# Patient Record
Sex: Female | Born: 1950 | Race: White | Hispanic: No | Marital: Married | Smoking: Current some day smoker
Health system: Southern US, Community
[De-identification: ages and names within clinical notes are randomized; demographics above are authoritative.]

## PROBLEM LIST (undated history)

## (undated) DIAGNOSIS — K219 Gastro-esophageal reflux disease without esophagitis: Secondary | ICD-10-CM

## (undated) DIAGNOSIS — M1990 Unspecified osteoarthritis, unspecified site: Secondary | ICD-10-CM

## (undated) DIAGNOSIS — H269 Unspecified cataract: Secondary | ICD-10-CM

## (undated) DIAGNOSIS — E785 Hyperlipidemia, unspecified: Secondary | ICD-10-CM

## (undated) DIAGNOSIS — I10 Essential (primary) hypertension: Secondary | ICD-10-CM

## (undated) HISTORY — PX: WISDOM TOOTH EXTRACTION: SHX21

## (undated) HISTORY — DX: Gastro-esophageal reflux disease without esophagitis: K21.9

## (undated) HISTORY — DX: Hyperlipidemia, unspecified: E78.5

## (undated) HISTORY — DX: Unspecified cataract: H26.9

## (undated) HISTORY — DX: Unspecified osteoarthritis, unspecified site: M19.90

## (undated) HISTORY — PX: HERNIA REPAIR: SHX51

## (undated) HISTORY — PX: PARTIAL HYSTERECTOMY: SHX80

---

## 2015-09-29 DIAGNOSIS — M1712 Unilateral primary osteoarthritis, left knee: Secondary | ICD-10-CM | POA: Diagnosis not present

## 2015-09-29 DIAGNOSIS — M1612 Unilateral primary osteoarthritis, left hip: Secondary | ICD-10-CM | POA: Diagnosis not present

## 2015-09-29 DIAGNOSIS — Z23 Encounter for immunization: Secondary | ICD-10-CM | POA: Diagnosis not present

## 2015-11-08 DIAGNOSIS — E7800 Pure hypercholesterolemia, unspecified: Secondary | ICD-10-CM | POA: Diagnosis not present

## 2015-11-08 DIAGNOSIS — R739 Hyperglycemia, unspecified: Secondary | ICD-10-CM | POA: Diagnosis not present

## 2015-11-18 DIAGNOSIS — Z13220 Encounter for screening for lipoid disorders: Secondary | ICD-10-CM | POA: Diagnosis not present

## 2015-11-18 DIAGNOSIS — Z720 Tobacco use: Secondary | ICD-10-CM | POA: Diagnosis not present

## 2015-11-18 DIAGNOSIS — Z23 Encounter for immunization: Secondary | ICD-10-CM | POA: Diagnosis not present

## 2016-01-10 DIAGNOSIS — R300 Dysuria: Secondary | ICD-10-CM | POA: Diagnosis not present

## 2016-01-10 DIAGNOSIS — N390 Urinary tract infection, site not specified: Secondary | ICD-10-CM | POA: Diagnosis not present

## 2016-01-10 DIAGNOSIS — J00 Acute nasopharyngitis [common cold]: Secondary | ICD-10-CM | POA: Diagnosis not present

## 2016-01-10 DIAGNOSIS — R350 Frequency of micturition: Secondary | ICD-10-CM | POA: Diagnosis not present

## 2016-01-14 DIAGNOSIS — R509 Fever, unspecified: Secondary | ICD-10-CM | POA: Diagnosis not present

## 2016-01-14 DIAGNOSIS — R05 Cough: Secondary | ICD-10-CM | POA: Diagnosis not present

## 2016-03-11 DIAGNOSIS — Z4682 Encounter for fitting and adjustment of non-vascular catheter: Secondary | ICD-10-CM | POA: Diagnosis not present

## 2016-03-11 DIAGNOSIS — F17200 Nicotine dependence, unspecified, uncomplicated: Secondary | ICD-10-CM | POA: Diagnosis not present

## 2016-03-11 DIAGNOSIS — S270XXA Traumatic pneumothorax, initial encounter: Secondary | ICD-10-CM | POA: Diagnosis not present

## 2016-03-11 DIAGNOSIS — W19XXXA Unspecified fall, initial encounter: Secondary | ICD-10-CM | POA: Diagnosis not present

## 2016-03-11 DIAGNOSIS — Z885 Allergy status to narcotic agent status: Secondary | ICD-10-CM | POA: Diagnosis not present

## 2016-03-11 DIAGNOSIS — S2242XA Multiple fractures of ribs, left side, initial encounter for closed fracture: Secondary | ICD-10-CM | POA: Diagnosis not present

## 2016-03-11 DIAGNOSIS — J449 Chronic obstructive pulmonary disease, unspecified: Secondary | ICD-10-CM | POA: Diagnosis not present

## 2016-03-11 DIAGNOSIS — E785 Hyperlipidemia, unspecified: Secondary | ICD-10-CM | POA: Diagnosis not present

## 2016-03-11 DIAGNOSIS — J939 Pneumothorax, unspecified: Secondary | ICD-10-CM | POA: Diagnosis not present

## 2016-03-11 DIAGNOSIS — S2232XA Fracture of one rib, left side, initial encounter for closed fracture: Secondary | ICD-10-CM | POA: Diagnosis not present

## 2016-03-12 DIAGNOSIS — S2243XA Multiple fractures of ribs, bilateral, initial encounter for closed fracture: Secondary | ICD-10-CM | POA: Diagnosis not present

## 2016-03-12 DIAGNOSIS — S270XXA Traumatic pneumothorax, initial encounter: Secondary | ICD-10-CM | POA: Diagnosis present

## 2016-03-12 DIAGNOSIS — J9811 Atelectasis: Secondary | ICD-10-CM | POA: Diagnosis not present

## 2016-03-12 DIAGNOSIS — J939 Pneumothorax, unspecified: Secondary | ICD-10-CM | POA: Diagnosis not present

## 2016-03-12 DIAGNOSIS — F17200 Nicotine dependence, unspecified, uncomplicated: Secondary | ICD-10-CM | POA: Diagnosis present

## 2016-03-12 DIAGNOSIS — E785 Hyperlipidemia, unspecified: Secondary | ICD-10-CM | POA: Diagnosis present

## 2016-03-12 DIAGNOSIS — S2242XA Multiple fractures of ribs, left side, initial encounter for closed fracture: Secondary | ICD-10-CM | POA: Diagnosis present

## 2016-03-12 DIAGNOSIS — Z885 Allergy status to narcotic agent status: Secondary | ICD-10-CM | POA: Diagnosis not present

## 2016-03-12 DIAGNOSIS — J449 Chronic obstructive pulmonary disease, unspecified: Secondary | ICD-10-CM | POA: Diagnosis present

## 2016-04-12 DIAGNOSIS — R05 Cough: Secondary | ICD-10-CM | POA: Diagnosis not present

## 2016-04-12 DIAGNOSIS — S2242XA Multiple fractures of ribs, left side, initial encounter for closed fracture: Secondary | ICD-10-CM | POA: Diagnosis not present

## 2016-04-12 DIAGNOSIS — S272XXA Traumatic hemopneumothorax, initial encounter: Secondary | ICD-10-CM | POA: Diagnosis not present

## 2016-04-12 DIAGNOSIS — J438 Other emphysema: Secondary | ICD-10-CM | POA: Diagnosis not present

## 2016-04-19 DIAGNOSIS — Z1389 Encounter for screening for other disorder: Secondary | ICD-10-CM | POA: Diagnosis not present

## 2016-04-19 DIAGNOSIS — S2242XA Multiple fractures of ribs, left side, initial encounter for closed fracture: Secondary | ICD-10-CM | POA: Diagnosis not present

## 2016-04-19 DIAGNOSIS — S270XXA Traumatic pneumothorax, initial encounter: Secondary | ICD-10-CM | POA: Diagnosis not present

## 2016-05-15 DIAGNOSIS — R739 Hyperglycemia, unspecified: Secondary | ICD-10-CM | POA: Diagnosis not present

## 2016-05-15 DIAGNOSIS — E6609 Other obesity due to excess calories: Secondary | ICD-10-CM | POA: Diagnosis not present

## 2016-05-15 DIAGNOSIS — Z13220 Encounter for screening for lipoid disorders: Secondary | ICD-10-CM | POA: Diagnosis not present

## 2016-05-15 DIAGNOSIS — E7800 Pure hypercholesterolemia, unspecified: Secondary | ICD-10-CM | POA: Diagnosis not present

## 2016-05-15 DIAGNOSIS — Z720 Tobacco use: Secondary | ICD-10-CM | POA: Diagnosis not present

## 2016-05-22 DIAGNOSIS — E7800 Pure hypercholesterolemia, unspecified: Secondary | ICD-10-CM | POA: Diagnosis not present

## 2016-05-22 DIAGNOSIS — S2242XD Multiple fractures of ribs, left side, subsequent encounter for fracture with routine healing: Secondary | ICD-10-CM | POA: Diagnosis not present

## 2016-06-28 DIAGNOSIS — Z6825 Body mass index (BMI) 25.0-25.9, adult: Secondary | ICD-10-CM | POA: Diagnosis not present

## 2016-06-28 DIAGNOSIS — R102 Pelvic and perineal pain: Secondary | ICD-10-CM | POA: Diagnosis not present

## 2016-07-12 DIAGNOSIS — R1032 Left lower quadrant pain: Secondary | ICD-10-CM | POA: Diagnosis not present

## 2016-07-12 DIAGNOSIS — R87610 Atypical squamous cells of undetermined significance on cytologic smear of cervix (ASC-US): Secondary | ICD-10-CM | POA: Diagnosis not present

## 2016-07-12 DIAGNOSIS — R87810 Cervical high risk human papillomavirus (HPV) DNA test positive: Secondary | ICD-10-CM | POA: Diagnosis not present

## 2016-07-20 DIAGNOSIS — K4190 Unilateral femoral hernia, without obstruction or gangrene, not specified as recurrent: Secondary | ICD-10-CM | POA: Diagnosis not present

## 2016-09-18 DIAGNOSIS — Z720 Tobacco use: Secondary | ICD-10-CM | POA: Diagnosis not present

## 2016-09-18 DIAGNOSIS — E7800 Pure hypercholesterolemia, unspecified: Secondary | ICD-10-CM | POA: Diagnosis not present

## 2016-09-18 DIAGNOSIS — F325 Major depressive disorder, single episode, in full remission: Secondary | ICD-10-CM | POA: Diagnosis not present

## 2016-09-18 DIAGNOSIS — R739 Hyperglycemia, unspecified: Secondary | ICD-10-CM | POA: Diagnosis not present

## 2016-09-18 DIAGNOSIS — J438 Other emphysema: Secondary | ICD-10-CM | POA: Diagnosis not present

## 2016-09-25 DIAGNOSIS — E7800 Pure hypercholesterolemia, unspecified: Secondary | ICD-10-CM | POA: Diagnosis not present

## 2016-09-25 DIAGNOSIS — Z0001 Encounter for general adult medical examination with abnormal findings: Secondary | ICD-10-CM | POA: Diagnosis not present

## 2016-09-25 DIAGNOSIS — Z1212 Encounter for screening for malignant neoplasm of rectum: Secondary | ICD-10-CM | POA: Diagnosis not present

## 2016-09-25 DIAGNOSIS — Z23 Encounter for immunization: Secondary | ICD-10-CM | POA: Diagnosis not present

## 2017-03-05 DIAGNOSIS — H5203 Hypermetropia, bilateral: Secondary | ICD-10-CM | POA: Diagnosis not present

## 2017-03-05 DIAGNOSIS — H524 Presbyopia: Secondary | ICD-10-CM | POA: Diagnosis not present

## 2017-03-05 DIAGNOSIS — H52203 Unspecified astigmatism, bilateral: Secondary | ICD-10-CM | POA: Diagnosis not present

## 2017-03-05 DIAGNOSIS — H2513 Age-related nuclear cataract, bilateral: Secondary | ICD-10-CM | POA: Diagnosis not present

## 2017-03-19 DIAGNOSIS — Z205 Contact with and (suspected) exposure to viral hepatitis: Secondary | ICD-10-CM | POA: Diagnosis not present

## 2017-03-19 DIAGNOSIS — E7800 Pure hypercholesterolemia, unspecified: Secondary | ICD-10-CM | POA: Diagnosis not present

## 2017-03-19 DIAGNOSIS — R739 Hyperglycemia, unspecified: Secondary | ICD-10-CM | POA: Diagnosis not present

## 2017-03-26 DIAGNOSIS — Z720 Tobacco use: Secondary | ICD-10-CM | POA: Diagnosis not present

## 2017-03-26 DIAGNOSIS — Z6825 Body mass index (BMI) 25.0-25.9, adult: Secondary | ICD-10-CM | POA: Diagnosis not present

## 2017-03-26 DIAGNOSIS — E7800 Pure hypercholesterolemia, unspecified: Secondary | ICD-10-CM | POA: Diagnosis not present

## 2017-08-31 DIAGNOSIS — R300 Dysuria: Secondary | ICD-10-CM | POA: Diagnosis not present

## 2017-08-31 DIAGNOSIS — Z6825 Body mass index (BMI) 25.0-25.9, adult: Secondary | ICD-10-CM | POA: Diagnosis not present

## 2017-09-17 DIAGNOSIS — Z720 Tobacco use: Secondary | ICD-10-CM | POA: Diagnosis not present

## 2017-09-17 DIAGNOSIS — F325 Major depressive disorder, single episode, in full remission: Secondary | ICD-10-CM | POA: Diagnosis not present

## 2017-09-17 DIAGNOSIS — E7800 Pure hypercholesterolemia, unspecified: Secondary | ICD-10-CM | POA: Diagnosis not present

## 2017-09-17 DIAGNOSIS — R739 Hyperglycemia, unspecified: Secondary | ICD-10-CM | POA: Diagnosis not present

## 2017-10-08 DIAGNOSIS — J069 Acute upper respiratory infection, unspecified: Secondary | ICD-10-CM | POA: Diagnosis not present

## 2017-10-08 DIAGNOSIS — Z23 Encounter for immunization: Secondary | ICD-10-CM | POA: Diagnosis not present

## 2017-10-08 DIAGNOSIS — Z6825 Body mass index (BMI) 25.0-25.9, adult: Secondary | ICD-10-CM | POA: Diagnosis not present

## 2017-10-08 DIAGNOSIS — E7800 Pure hypercholesterolemia, unspecified: Secondary | ICD-10-CM | POA: Diagnosis not present

## 2017-10-08 DIAGNOSIS — Z1389 Encounter for screening for other disorder: Secondary | ICD-10-CM | POA: Diagnosis not present

## 2017-10-08 DIAGNOSIS — F325 Major depressive disorder, single episode, in full remission: Secondary | ICD-10-CM | POA: Diagnosis not present

## 2017-10-08 DIAGNOSIS — Z0001 Encounter for general adult medical examination with abnormal findings: Secondary | ICD-10-CM | POA: Diagnosis not present

## 2017-10-08 DIAGNOSIS — Z720 Tobacco use: Secondary | ICD-10-CM | POA: Diagnosis not present

## 2018-03-18 DIAGNOSIS — K4090 Unilateral inguinal hernia, without obstruction or gangrene, not specified as recurrent: Secondary | ICD-10-CM | POA: Diagnosis not present

## 2018-03-25 DIAGNOSIS — E785 Hyperlipidemia, unspecified: Secondary | ICD-10-CM | POA: Diagnosis not present

## 2018-03-25 DIAGNOSIS — Z8249 Family history of ischemic heart disease and other diseases of the circulatory system: Secondary | ICD-10-CM | POA: Diagnosis not present

## 2018-03-25 DIAGNOSIS — K4090 Unilateral inguinal hernia, without obstruction or gangrene, not specified as recurrent: Secondary | ICD-10-CM | POA: Diagnosis not present

## 2018-03-25 DIAGNOSIS — R12 Heartburn: Secondary | ICD-10-CM | POA: Diagnosis not present

## 2018-03-25 DIAGNOSIS — Z886 Allergy status to analgesic agent status: Secondary | ICD-10-CM | POA: Diagnosis not present

## 2018-03-25 DIAGNOSIS — Z9851 Tubal ligation status: Secondary | ICD-10-CM | POA: Diagnosis not present

## 2018-03-25 DIAGNOSIS — F17200 Nicotine dependence, unspecified, uncomplicated: Secondary | ICD-10-CM | POA: Diagnosis not present

## 2018-03-25 DIAGNOSIS — Z79899 Other long term (current) drug therapy: Secondary | ICD-10-CM | POA: Diagnosis not present

## 2018-04-01 DIAGNOSIS — F325 Major depressive disorder, single episode, in full remission: Secondary | ICD-10-CM | POA: Diagnosis not present

## 2018-04-01 DIAGNOSIS — J438 Other emphysema: Secondary | ICD-10-CM | POA: Diagnosis not present

## 2018-04-01 DIAGNOSIS — R739 Hyperglycemia, unspecified: Secondary | ICD-10-CM | POA: Diagnosis not present

## 2018-04-01 DIAGNOSIS — E7800 Pure hypercholesterolemia, unspecified: Secondary | ICD-10-CM | POA: Diagnosis not present

## 2018-04-08 DIAGNOSIS — F325 Major depressive disorder, single episode, in full remission: Secondary | ICD-10-CM | POA: Diagnosis not present

## 2018-04-08 DIAGNOSIS — Z720 Tobacco use: Secondary | ICD-10-CM | POA: Diagnosis not present

## 2018-04-08 DIAGNOSIS — Z6825 Body mass index (BMI) 25.0-25.9, adult: Secondary | ICD-10-CM | POA: Diagnosis not present

## 2018-04-08 DIAGNOSIS — R05 Cough: Secondary | ICD-10-CM | POA: Diagnosis not present

## 2018-04-08 DIAGNOSIS — E782 Mixed hyperlipidemia: Secondary | ICD-10-CM | POA: Diagnosis not present

## 2018-04-24 DIAGNOSIS — H9202 Otalgia, left ear: Secondary | ICD-10-CM | POA: Diagnosis not present

## 2018-04-24 DIAGNOSIS — Z6825 Body mass index (BMI) 25.0-25.9, adult: Secondary | ICD-10-CM | POA: Diagnosis not present

## 2018-04-24 DIAGNOSIS — G510 Bell's palsy: Secondary | ICD-10-CM | POA: Diagnosis not present

## 2018-05-06 DIAGNOSIS — G510 Bell's palsy: Secondary | ICD-10-CM | POA: Diagnosis not present

## 2018-05-06 DIAGNOSIS — H9202 Otalgia, left ear: Secondary | ICD-10-CM | POA: Diagnosis not present

## 2018-05-06 DIAGNOSIS — Z6825 Body mass index (BMI) 25.0-25.9, adult: Secondary | ICD-10-CM | POA: Diagnosis not present

## 2018-05-07 DIAGNOSIS — H9202 Otalgia, left ear: Secondary | ICD-10-CM | POA: Diagnosis not present

## 2018-05-07 DIAGNOSIS — G510 Bell's palsy: Secondary | ICD-10-CM | POA: Diagnosis not present

## 2018-05-07 DIAGNOSIS — H9113 Presbycusis, bilateral: Secondary | ICD-10-CM | POA: Diagnosis not present

## 2018-05-07 DIAGNOSIS — H903 Sensorineural hearing loss, bilateral: Secondary | ICD-10-CM | POA: Diagnosis not present

## 2018-05-08 ENCOUNTER — Other Ambulatory Visit: Payer: Self-pay | Admitting: Otolaryngology

## 2018-05-08 DIAGNOSIS — G510 Bell's palsy: Secondary | ICD-10-CM

## 2018-05-12 ENCOUNTER — Ambulatory Visit
Admission: RE | Admit: 2018-05-12 | Discharge: 2018-05-12 | Disposition: A | Discharge status: Home / Self Care | Payer: Self-pay | Source: Ambulatory Visit | Attending: Otolaryngology | Admitting: Otolaryngology

## 2018-05-12 ENCOUNTER — Encounter: Payer: Self-pay | Admitting: Radiology

## 2018-05-12 DIAGNOSIS — G510 Bell's palsy: Secondary | ICD-10-CM

## 2018-05-12 DIAGNOSIS — H748X3 Other specified disorders of middle ear and mastoid, bilateral: Secondary | ICD-10-CM | POA: Diagnosis not present

## 2018-05-12 MED ORDER — GADOBENATE DIMEGLUMINE 529 MG/ML IV SOLN
13.0000 mL | Freq: Once | INTRAVENOUS | Status: AC | PRN
  Administered 2018-05-12: 13 mL via INTRAVENOUS

## 2018-07-23 DIAGNOSIS — M65341 Trigger finger, right ring finger: Secondary | ICD-10-CM | POA: Diagnosis not present

## 2018-07-23 DIAGNOSIS — Z6825 Body mass index (BMI) 25.0-25.9, adult: Secondary | ICD-10-CM | POA: Diagnosis not present

## 2018-08-06 DIAGNOSIS — M65341 Trigger finger, right ring finger: Secondary | ICD-10-CM | POA: Diagnosis not present

## 2018-10-04 ENCOUNTER — Other Ambulatory Visit: Payer: Self-pay

## 2018-10-07 DIAGNOSIS — Z6825 Body mass index (BMI) 25.0-25.9, adult: Secondary | ICD-10-CM | POA: Diagnosis not present

## 2018-10-07 DIAGNOSIS — M5432 Sciatica, left side: Secondary | ICD-10-CM | POA: Diagnosis not present

## 2018-10-21 DIAGNOSIS — E7800 Pure hypercholesterolemia, unspecified: Secondary | ICD-10-CM | POA: Diagnosis not present

## 2018-10-21 DIAGNOSIS — E782 Mixed hyperlipidemia: Secondary | ICD-10-CM | POA: Diagnosis not present

## 2018-10-21 DIAGNOSIS — R739 Hyperglycemia, unspecified: Secondary | ICD-10-CM | POA: Diagnosis not present

## 2018-10-21 DIAGNOSIS — G510 Bell's palsy: Secondary | ICD-10-CM | POA: Diagnosis not present

## 2018-10-28 DIAGNOSIS — R7301 Impaired fasting glucose: Secondary | ICD-10-CM | POA: Diagnosis not present

## 2018-10-28 DIAGNOSIS — Z1389 Encounter for screening for other disorder: Secondary | ICD-10-CM | POA: Diagnosis not present

## 2018-10-28 DIAGNOSIS — G252 Other specified forms of tremor: Secondary | ICD-10-CM | POA: Diagnosis not present

## 2018-10-28 DIAGNOSIS — F325 Major depressive disorder, single episode, in full remission: Secondary | ICD-10-CM | POA: Diagnosis not present

## 2018-10-28 DIAGNOSIS — E782 Mixed hyperlipidemia: Secondary | ICD-10-CM | POA: Diagnosis not present

## 2018-10-28 DIAGNOSIS — R05 Cough: Secondary | ICD-10-CM | POA: Diagnosis not present

## 2018-10-28 DIAGNOSIS — Z23 Encounter for immunization: Secondary | ICD-10-CM | POA: Diagnosis not present

## 2018-10-28 DIAGNOSIS — Z1331 Encounter for screening for depression: Secondary | ICD-10-CM | POA: Diagnosis not present

## 2018-11-07 DIAGNOSIS — Z6826 Body mass index (BMI) 26.0-26.9, adult: Secondary | ICD-10-CM | POA: Diagnosis not present

## 2018-11-07 DIAGNOSIS — N3000 Acute cystitis without hematuria: Secondary | ICD-10-CM | POA: Diagnosis not present

## 2018-12-06 DIAGNOSIS — M5432 Sciatica, left side: Secondary | ICD-10-CM | POA: Diagnosis not present

## 2018-12-06 DIAGNOSIS — Z6826 Body mass index (BMI) 26.0-26.9, adult: Secondary | ICD-10-CM | POA: Diagnosis not present

## 2018-12-12 DIAGNOSIS — M5442 Lumbago with sciatica, left side: Secondary | ICD-10-CM | POA: Diagnosis not present

## 2018-12-12 DIAGNOSIS — M545 Low back pain: Secondary | ICD-10-CM | POA: Diagnosis not present

## 2018-12-26 DIAGNOSIS — M4310 Spondylolisthesis, site unspecified: Secondary | ICD-10-CM | POA: Diagnosis not present

## 2018-12-26 DIAGNOSIS — Z6825 Body mass index (BMI) 25.0-25.9, adult: Secondary | ICD-10-CM | POA: Diagnosis not present

## 2018-12-26 DIAGNOSIS — R030 Elevated blood-pressure reading, without diagnosis of hypertension: Secondary | ICD-10-CM | POA: Diagnosis not present

## 2018-12-27 ENCOUNTER — Other Ambulatory Visit: Payer: Self-pay | Admitting: Neurological Surgery

## 2018-12-27 DIAGNOSIS — M4310 Spondylolisthesis, site unspecified: Secondary | ICD-10-CM

## 2019-01-02 ENCOUNTER — Ambulatory Visit
Admission: RE | Admit: 2019-01-02 | Discharge: 2019-01-02 | Disposition: A | Discharge status: Home / Self Care | Payer: Medicare Other | Source: Ambulatory Visit | Attending: Neurological Surgery | Admitting: Neurological Surgery

## 2019-01-02 DIAGNOSIS — M48061 Spinal stenosis, lumbar region without neurogenic claudication: Secondary | ICD-10-CM | POA: Diagnosis not present

## 2019-01-02 DIAGNOSIS — M4310 Spondylolisthesis, site unspecified: Secondary | ICD-10-CM

## 2019-01-02 MED ORDER — METHYLPREDNISOLONE ACETATE 40 MG/ML INJ SUSP (RADIOLOG
120.0000 mg | Freq: Once | INTRAMUSCULAR | Status: AC
  Administered 2019-01-02: 120 mg via EPIDURAL

## 2019-01-02 MED ORDER — IOPAMIDOL (ISOVUE-M 200) INJECTION 41%
1.0000 mL | Freq: Once | INTRAMUSCULAR | Status: AC
  Administered 2019-01-02: 1 mL via EPIDURAL

## 2019-01-02 NOTE — Discharge Instructions (Signed)

## 2019-02-21 DIAGNOSIS — M4310 Spondylolisthesis, site unspecified: Secondary | ICD-10-CM | POA: Diagnosis not present

## 2019-04-07 ENCOUNTER — Other Ambulatory Visit: Payer: Self-pay | Admitting: Neurological Surgery

## 2019-04-07 DIAGNOSIS — M4310 Spondylolisthesis, site unspecified: Secondary | ICD-10-CM

## 2019-04-21 DIAGNOSIS — E7800 Pure hypercholesterolemia, unspecified: Secondary | ICD-10-CM | POA: Diagnosis not present

## 2019-04-21 DIAGNOSIS — R7301 Impaired fasting glucose: Secondary | ICD-10-CM | POA: Diagnosis not present

## 2019-04-21 DIAGNOSIS — E782 Mixed hyperlipidemia: Secondary | ICD-10-CM | POA: Diagnosis not present

## 2019-04-28 DIAGNOSIS — I10 Essential (primary) hypertension: Secondary | ICD-10-CM | POA: Diagnosis not present

## 2019-04-28 DIAGNOSIS — F325 Major depressive disorder, single episode, in full remission: Secondary | ICD-10-CM | POA: Diagnosis not present

## 2019-04-28 DIAGNOSIS — R05 Cough: Secondary | ICD-10-CM | POA: Diagnosis not present

## 2019-04-28 DIAGNOSIS — Z0001 Encounter for general adult medical examination with abnormal findings: Secondary | ICD-10-CM | POA: Diagnosis not present

## 2019-04-28 DIAGNOSIS — R7301 Impaired fasting glucose: Secondary | ICD-10-CM | POA: Diagnosis not present

## 2019-04-28 DIAGNOSIS — Z6825 Body mass index (BMI) 25.0-25.9, adult: Secondary | ICD-10-CM | POA: Diagnosis not present

## 2019-04-28 DIAGNOSIS — Z720 Tobacco use: Secondary | ICD-10-CM | POA: Diagnosis not present

## 2019-04-28 DIAGNOSIS — E782 Mixed hyperlipidemia: Secondary | ICD-10-CM | POA: Diagnosis not present

## 2019-05-05 ENCOUNTER — Other Ambulatory Visit: Payer: Self-pay

## 2019-05-05 ENCOUNTER — Ambulatory Visit
Admission: RE | Admit: 2019-05-05 | Discharge: 2019-05-05 | Disposition: A | Discharge status: Home / Self Care | Payer: Medicare Other | Source: Ambulatory Visit | Attending: Neurological Surgery | Admitting: Neurological Surgery

## 2019-05-05 DIAGNOSIS — M4310 Spondylolisthesis, site unspecified: Secondary | ICD-10-CM | POA: Diagnosis not present

## 2019-05-05 DIAGNOSIS — M4727 Other spondylosis with radiculopathy, lumbosacral region: Secondary | ICD-10-CM | POA: Diagnosis not present

## 2019-05-05 MED ORDER — IOPAMIDOL (ISOVUE-M 200) INJECTION 41%
1.0000 mL | Freq: Once | INTRAMUSCULAR | Status: AC
  Administered 2019-05-05: 1 mL via EPIDURAL

## 2019-05-05 MED ORDER — METHYLPREDNISOLONE ACETATE 40 MG/ML INJ SUSP (RADIOLOG
120.0000 mg | Freq: Once | INTRAMUSCULAR | Status: AC
  Administered 2019-05-05: 120 mg via EPIDURAL

## 2019-05-05 NOTE — Discharge Instructions (Signed)

## 2019-06-09 DIAGNOSIS — R05 Cough: Secondary | ICD-10-CM | POA: Diagnosis not present

## 2019-06-09 DIAGNOSIS — Z6825 Body mass index (BMI) 25.0-25.9, adult: Secondary | ICD-10-CM | POA: Diagnosis not present

## 2019-06-09 DIAGNOSIS — R490 Dysphonia: Secondary | ICD-10-CM | POA: Diagnosis not present

## 2019-06-13 DIAGNOSIS — M4310 Spondylolisthesis, site unspecified: Secondary | ICD-10-CM | POA: Diagnosis not present

## 2019-06-16 ENCOUNTER — Other Ambulatory Visit: Payer: Self-pay | Admitting: Neurological Surgery

## 2019-07-03 NOTE — Pre-Procedure Instructions (Signed)
Carolyn Holmes  07/03/2019      [id number] - [place], [place] - [address] [place] [address] Phone: [phone] Fax: [phone]    Your procedure is scheduled on July 08, 2019.  Report to [facility] Admitting at 530 AM.  Call this number if you have problems the morning of surgery:  [phone]   Remember:  Do not eat or drink after midnight.    Take these medicines the morning of surgery with A SIP OF WATER  Esomeprazole (Nexium)  Beginning now, STOP taking any Aspirin (unless otherwise instructed by your surgeon), Aleve, Naproxen, Ibuprofen, Motrin, Advil, Goody's, BC's, all herbal medications, fish oil, and all vitamins   Day of surgery:  Do not wear jewelry, make-up or nail polish.  Do not wear lotions, powders, or perfumes, or deodorant.  Do not shave 48 hours prior to surgery.    Do not bring valuables to the hospital.  [facility] is not responsible for any belongings or valuables.  IF you are a smoker, DO NOT Smoke 24 hours prior to surgery   IF you wear a CPAP at night please bring your mask, tubing, and machine the morning of surgery    Remember that you must have someone to transport you home after your surgery, and remain with you for 24 hours if you are discharged the same day.  Contacts, dentures or bridgework may not be worn into surgery.  Leave your suitcase in the car.  After surgery it may be brought to your room.  For patients admitted to the hospital, discharge time will be determined by your treatment team.  Patients discharged the day of surgery will not be allowed to drive home.    [facility]- Preparing For Surgery  Before surgery, you can play an important role. Because skin is not sterile, your skin needs to be as free of germs as possible. You can reduce the number of germs on your skin by washing with CHG (chlorahexidine gluconate) Soap before surgery.  CHG is an antiseptic cleaner  which kills germs and bonds with the skin to continue killing germs even after washing.    Oral Hygiene is also important to reduce your risk of infection.  Remember - BRUSH YOUR TEETH THE MORNING OF SURGERY WITH YOUR REGULAR TOOTHPASTE  Please do not use if you have an allergy to CHG or antibacterial soaps. If your skin becomes reddened/irritated stop using the CHG.  Do not shave (including legs and underarms) for at least 48 hours prior to first CHG shower. It is OK to shave your face.  Please follow these instructions carefully.   1. Shower the NIGHT BEFORE SURGERY and the MORNING OF SURGERY with CHG.   2. If you chose to wash your hair, wash your hair first as usual with your normal shampoo.  3. After you shampoo, rinse your hair and body thoroughly to remove the shampoo.  4. Use CHG as you would any other liquid soap. You can apply CHG directly to the skin and wash gently with a scrungie or a clean washcloth.   5. Apply the CHG Soap to your body ONLY FROM THE NECK DOWN.  Do not use on open wounds or open sores. Avoid contact with your eyes, ears, mouth and genitals (private parts). Wash Face and genitals (private parts)  with your normal soap.  6. Wash thoroughly, paying special attention to the area where your surgery will be performed.  7.  Thoroughly rinse your body with warm water from the neck down.  8. DO NOT shower/wash with your normal soap after using and rinsing off the CHG Soap.  9. Pat yourself dry with a CLEAN TOWEL.  10. Wear CLEAN PAJAMAS to bed the night before surgery, wear comfortable clothes the morning of surgery  11. Place CLEAN SHEETS on your bed the night of your first shower and DO NOT SLEEP WITH PETS.  Day of Surgery: Shower as above Do not apply any deodorants/lotions.  Please wear clean clothes to the hospital/surgery center.   Remember to brush your teeth WITH YOUR REGULAR TOOTHPASTE.   Please read over the following fact sheets that you were  given.

## 2019-07-04 ENCOUNTER — Encounter (HOSPITAL_COMMUNITY): Payer: Self-pay

## 2019-07-04 ENCOUNTER — Encounter (HOSPITAL_COMMUNITY)
Admission: RE | Admit: 2019-07-04 | Discharge: 2019-07-04 | Disposition: A | Discharge status: Home / Self Care | Payer: Medicare Other | Source: Ambulatory Visit | Attending: Neurological Surgery | Admitting: Neurological Surgery

## 2019-07-04 ENCOUNTER — Other Ambulatory Visit: Payer: Self-pay

## 2019-07-04 ENCOUNTER — Other Ambulatory Visit (HOSPITAL_COMMUNITY)
Admission: RE | Admit: 2019-07-04 | Discharge: 2019-07-04 | Disposition: A | Discharge status: Home / Self Care | Payer: Medicare Other | Source: Ambulatory Visit | Attending: Neurological Surgery | Admitting: Neurological Surgery

## 2019-07-04 DIAGNOSIS — Z01818 Encounter for other preprocedural examination: Secondary | ICD-10-CM | POA: Insufficient documentation

## 2019-07-04 DIAGNOSIS — M4316 Spondylolisthesis, lumbar region: Secondary | ICD-10-CM | POA: Diagnosis not present

## 2019-07-04 DIAGNOSIS — I498 Other specified cardiac arrhythmias: Secondary | ICD-10-CM | POA: Diagnosis not present

## 2019-07-04 DIAGNOSIS — Z20828 Contact with and (suspected) exposure to other viral communicable diseases: Secondary | ICD-10-CM | POA: Diagnosis not present

## 2019-07-04 HISTORY — DX: Essential (primary) hypertension: I10

## 2019-07-04 LAB — CBC
HCT: 44.4 % (ref 36.0–46.0)
Hemoglobin: 14.6 g/dL (ref 12.0–15.0)
MCH: 30.2 pg (ref 26.0–34.0)
MCHC: 32.9 g/dL (ref 30.0–36.0)
MCV: 91.9 fL (ref 80.0–100.0)
Platelets: 355 10*3/uL (ref 150–400)
RBC: 4.83 MIL/uL (ref 3.87–5.11)
RDW: 13.2 % (ref 11.5–15.5)
WBC: 9.9 10*3/uL (ref 4.0–10.5)
nRBC: 0 % (ref 0.0–0.2)

## 2019-07-04 LAB — BASIC METABOLIC PANEL
Anion gap: 10 (ref 5–15)
BUN: 11 mg/dL (ref 8–23)
CO2: 24 mmol/L (ref 22–32)
Calcium: 9.6 mg/dL (ref 8.9–10.3)
Chloride: 107 mmol/L (ref 98–111)
Creatinine, Ser: 0.54 mg/dL (ref 0.44–1.00)
GFR calc Af Amer: >60 mL/min (ref >60)
GFR calc non Af Amer: >60 mL/min (ref >60)
Glucose, Bld: 109 mg/dL — ABNORMAL HIGH (ref 70–99)
Potassium: 4 mmol/L (ref 3.5–5.1)
Sodium: 141 mmol/L (ref 135–145)

## 2019-07-04 LAB — SURGICAL PCR SCREEN
MRSA, PCR: NEGATIVE
Staphylococcus aureus: NEGATIVE

## 2019-07-04 LAB — TYPE AND SCREEN
ABO/RH(D): O POS
Antibody Screen: NEGATIVE

## 2019-07-04 LAB — SARS CORONAVIRUS 2 (TAT 6-24 HRS): SARS Coronavirus 2: NEGATIVE

## 2019-07-04 LAB — ABO/RH: ABO/RH(D): O POS

## 2019-07-04 NOTE — Progress Notes (Signed)
PCP - Orbie Hurst Mdical in [place] Cardiologist - na  Chest x-ray - na EKG - today Stress Test - na ECHO - na Cardiac Cath - na  Sleep Study -  na CPAP -   Fasting Blood Sugar - na Checks Blood Sugar _____ times a day  Blood Thinner Instructions:na Aspirin Instructions:na  Anesthesia review:   Patient denies shortness of breath, fever, cough and chest pain at PAT appointment   Patient verbalized understanding of instructions that were given to them at the PAT appointment. Patient was also instructed that they will need to review over the PAT instructions again at home before surgery.

## 2019-07-07 NOTE — Anesthesia Preprocedure Evaluation (Addendum)
Anesthesia Evaluation  Patient identified by MRN, date of birth, ID band Patient awake    Reviewed: Allergy & Precautions, H&P , NPO status , Patient's Chart, lab work & pertinent test results  Airway Mallampati: II  TM Distance: >3 FB Neck ROM: Full    Dental no notable dental hx. (+) Teeth Intact, Dental Advisory Given   Pulmonary Current Smoker and Patient abstained from smoking.,    Pulmonary exam normal breath sounds clear to auscultation       Cardiovascular Exercise Tolerance: Good hypertension, Pt. on medications  Rhythm:Regular Rate:Normal     Neuro/Psych negative neurological ROS  negative psych ROS   GI/Hepatic negative GI ROS, Neg liver ROS,   Endo/Other  negative endocrine ROS  Renal/GU negative Renal ROS  negative genitourinary   Musculoskeletal   Abdominal   Peds  Hematology negative hematology ROS (+)   Anesthesia Other Findings   Reproductive/Obstetrics negative OB ROS                            Anesthesia Physical Anesthesia Plan  ASA: II  Anesthesia Plan: General   Post-op Pain Management:    Induction: Intravenous  PONV Risk Score and Plan: 3 and Ondansetron, Dexamethasone and Midazolam  Airway Management Planned: Oral ETT  Additional Equipment:   Intra-op Plan:   Post-operative Plan: Extubation in OR  Informed Consent: I have reviewed the patients History and Physical, chart, labs and discussed the procedure including the risks, benefits and alternatives for the proposed anesthesia with the patient or authorized representative who has indicated his/her understanding and acceptance.     Dental advisory given  Plan Discussed with: CRNA  Anesthesia Plan Comments:         Anesthesia Quick Evaluation

## 2019-07-08 ENCOUNTER — Inpatient Hospital Stay (HOSPITAL_COMMUNITY): Payer: Medicare Other | Admitting: Anesthesiology

## 2019-07-08 ENCOUNTER — Inpatient Hospital Stay (HOSPITAL_COMMUNITY)
Admission: RE | Admit: 2019-07-08 | Discharge: 2019-07-09 | DRG: 460 | Disposition: A | Discharge status: Home Health | Payer: Medicare Other | Attending: Neurological Surgery | Admitting: Neurological Surgery

## 2019-07-08 ENCOUNTER — Inpatient Hospital Stay (HOSPITAL_COMMUNITY): Payer: Medicare Other

## 2019-07-08 ENCOUNTER — Encounter (HOSPITAL_COMMUNITY)
Admission: RE | Disposition: A | Discharge status: Home Health | Payer: Self-pay | Source: Home / Self Care | Attending: Neurological Surgery

## 2019-07-08 ENCOUNTER — Other Ambulatory Visit: Payer: Self-pay

## 2019-07-08 ENCOUNTER — Encounter (HOSPITAL_COMMUNITY): Payer: Self-pay

## 2019-07-08 DIAGNOSIS — I10 Essential (primary) hypertension: Secondary | ICD-10-CM | POA: Diagnosis present

## 2019-07-08 DIAGNOSIS — M5416 Radiculopathy, lumbar region: Secondary | ICD-10-CM | POA: Diagnosis not present

## 2019-07-08 DIAGNOSIS — M4317 Spondylolisthesis, lumbosacral region: Secondary | ICD-10-CM | POA: Diagnosis not present

## 2019-07-08 DIAGNOSIS — M545 Low back pain: Secondary | ICD-10-CM | POA: Diagnosis not present

## 2019-07-08 DIAGNOSIS — M4807 Spinal stenosis, lumbosacral region: Secondary | ICD-10-CM | POA: Diagnosis present

## 2019-07-08 DIAGNOSIS — M4327 Fusion of spine, lumbosacral region: Secondary | ICD-10-CM | POA: Diagnosis not present

## 2019-07-08 DIAGNOSIS — M5417 Radiculopathy, lumbosacral region: Secondary | ICD-10-CM | POA: Diagnosis present

## 2019-07-08 DIAGNOSIS — F17210 Nicotine dependence, cigarettes, uncomplicated: Secondary | ICD-10-CM | POA: Diagnosis present

## 2019-07-08 DIAGNOSIS — M4316 Spondylolisthesis, lumbar region: Secondary | ICD-10-CM | POA: Diagnosis present

## 2019-07-08 DIAGNOSIS — Z419 Encounter for procedure for purposes other than remedying health state, unspecified: Secondary | ICD-10-CM

## 2019-07-08 HISTORY — PX: TRANSFORAMINAL LUMBAR INTERBODY FUSION (TLIF) WITH PEDICLE SCREW FIXATION 1 LEVEL: SHX6141

## 2019-07-08 SURGERY — TRANSFORAMINAL LUMBAR INTERBODY FUSION (TLIF) WITH PEDICLE SCREW FIXATION 1 LEVEL
Anesthesia: General | Site: Spine Lumbar | Laterality: Left

## 2019-07-08 MED ORDER — THROMBIN 5000 UNITS EX SOLR
CUTANEOUS | Status: AC
  Filled 2019-07-08: qty 5000

## 2019-07-08 MED ORDER — SODIUM CHLORIDE 0.9 % IV SOLN
INTRAVENOUS | Status: DC | PRN
  Administered 2019-07-08: 08:00:00 25 ug/min via INTRAVENOUS

## 2019-07-08 MED ORDER — DOCUSATE SODIUM 100 MG PO CAPS
100.0000 mg | ORAL_CAPSULE | Freq: Two times a day (BID) | ORAL | Status: DC
  Administered 2019-07-08: 100 mg via ORAL
  Filled 2019-07-08: qty 1

## 2019-07-08 MED ORDER — 0.9 % SODIUM CHLORIDE (POUR BTL) OPTIME
TOPICAL | Status: DC | PRN
  Administered 2019-07-08: 1000 mL

## 2019-07-08 MED ORDER — SODIUM CHLORIDE 0.9 % IV SOLN
INTRAVENOUS | Status: DC | PRN
  Administered 2019-07-08: 500 mL

## 2019-07-08 MED ORDER — HYDROMORPHONE HCL 1 MG/ML IJ SOLN
INTRAMUSCULAR | Status: AC
  Filled 2019-07-08: qty 1

## 2019-07-08 MED ORDER — THROMBIN 5000 UNITS EX SOLR
OROMUCOSAL | Status: DC | PRN
  Administered 2019-07-08: 5 mL

## 2019-07-08 MED ORDER — DEXAMETHASONE SODIUM PHOSPHATE 10 MG/ML IJ SOLN
INTRAMUSCULAR | Status: DC | PRN
  Administered 2019-07-08: 5 mg via INTRAVENOUS

## 2019-07-08 MED ORDER — ROSUVASTATIN CALCIUM 5 MG PO TABS
10.0000 mg | ORAL_TABLET | Freq: Every day | ORAL | Status: DC
  Administered 2019-07-08: 10 mg via ORAL
  Filled 2019-07-08: qty 2

## 2019-07-08 MED ORDER — ACETAMINOPHEN 325 MG PO TABS: 650.0000 mg | ORAL_TABLET | ORAL | Status: DC | PRN

## 2019-07-08 MED ORDER — SODIUM CHLORIDE 0.9% FLUSH
3.0000 mL | Freq: Two times a day (BID) | INTRAVENOUS | Status: DC
  Administered 2019-07-08: 3 mL via INTRAVENOUS

## 2019-07-08 MED ORDER — MIDAZOLAM HCL 2 MG/2ML IJ SOLN
INTRAMUSCULAR | Status: AC
  Filled 2019-07-08: qty 2

## 2019-07-08 MED ORDER — ONDANSETRON HCL 4 MG PO TABS: 4.0000 mg | ORAL_TABLET | Freq: Four times a day (QID) | ORAL | Status: DC | PRN

## 2019-07-08 MED ORDER — MENTHOL 3 MG MT LOZG: 1.0000 | LOZENGE | OROMUCOSAL | Status: DC | PRN

## 2019-07-08 MED ORDER — ARTIFICIAL TEARS OPHTHALMIC OINT
TOPICAL_OINTMENT | OPHTHALMIC | Status: AC
  Filled 2019-07-08: qty 3.5

## 2019-07-08 MED ORDER — FENTANYL CITRATE (PF) 250 MCG/5ML IJ SOLN
INTRAMUSCULAR | Status: DC | PRN
  Administered 2019-07-08 (×5): 50 ug via INTRAVENOUS

## 2019-07-08 MED ORDER — DEXAMETHASONE SODIUM PHOSPHATE 10 MG/ML IJ SOLN
INTRAMUSCULAR | Status: AC
  Filled 2019-07-08: qty 1

## 2019-07-08 MED ORDER — CEFAZOLIN SODIUM-DEXTROSE 2-4 GM/100ML-% IV SOLN
2.0000 g | INTRAVENOUS | Status: AC
  Administered 2019-07-08: 2 g via INTRAVENOUS

## 2019-07-08 MED ORDER — MIDAZOLAM HCL 2 MG/2ML IJ SOLN
INTRAMUSCULAR | Status: DC | PRN
  Administered 2019-07-08: 2 mg via INTRAVENOUS

## 2019-07-08 MED ORDER — SUGAMMADEX SODIUM 200 MG/2ML IV SOLN
INTRAVENOUS | Status: DC | PRN
  Administered 2019-07-08: 300 mg via INTRAVENOUS

## 2019-07-08 MED ORDER — CEFAZOLIN SODIUM-DEXTROSE 2-4 GM/100ML-% IV SOLN
2.0000 g | Freq: Three times a day (TID) | INTRAVENOUS | Status: AC
  Administered 2019-07-08 (×2): 2 g via INTRAVENOUS
  Filled 2019-07-08 (×2): qty 100

## 2019-07-08 MED ORDER — SODIUM CHLORIDE 0.9% FLUSH: 3.0000 mL | INTRAVENOUS | Status: DC | PRN

## 2019-07-08 MED ORDER — ROCURONIUM BROMIDE 10 MG/ML (PF) SYRINGE
PREFILLED_SYRINGE | INTRAVENOUS | Status: DC | PRN
  Administered 2019-07-08 (×3): 50 mg via INTRAVENOUS

## 2019-07-08 MED ORDER — ROCURONIUM BROMIDE 10 MG/ML (PF) SYRINGE
PREFILLED_SYRINGE | INTRAVENOUS | Status: AC
  Filled 2019-07-08: qty 10

## 2019-07-08 MED ORDER — LIDOCAINE 2% (20 MG/ML) 5 ML SYRINGE
INTRAMUSCULAR | Status: DC | PRN
  Administered 2019-07-08: 60 mg via INTRAVENOUS

## 2019-07-08 MED ORDER — MELATONIN 3 MG PO TABS
18.0000 mg | ORAL_TABLET | Freq: Every day | ORAL | Status: DC
  Administered 2019-07-08: 18 mg via ORAL
  Filled 2019-07-08: qty 6

## 2019-07-08 MED ORDER — LIDOCAINE-EPINEPHRINE 1 %-1:100000 IJ SOLN
INTRAMUSCULAR | Status: DC | PRN
  Administered 2019-07-08: 5 mL

## 2019-07-08 MED ORDER — FENTANYL CITRATE (PF) 250 MCG/5ML IJ SOLN
INTRAMUSCULAR | Status: AC
  Filled 2019-07-08: qty 5

## 2019-07-08 MED ORDER — HYDROMORPHONE HCL 1 MG/ML IJ SOLN: 0.5000 mg | INTRAMUSCULAR | Status: DC | PRN

## 2019-07-08 MED ORDER — SODIUM CHLORIDE 0.9 % IV SOLN: 250.0000 mL | INTRAVENOUS | Status: DC

## 2019-07-08 MED ORDER — PROPOFOL 10 MG/ML IV BOLUS
INTRAVENOUS | Status: DC | PRN
  Administered 2019-07-08: 180 mg via INTRAVENOUS

## 2019-07-08 MED ORDER — HYDROMORPHONE HCL 1 MG/ML IJ SOLN
0.2500 mg | INTRAMUSCULAR | Status: DC | PRN
  Administered 2019-07-08: 0.5 mg via INTRAVENOUS

## 2019-07-08 MED ORDER — BUPIVACAINE HCL (PF) 0.5 % IJ SOLN
INTRAMUSCULAR | Status: DC | PRN
  Administered 2019-07-08: 5 mL

## 2019-07-08 MED ORDER — IRBESARTAN 75 MG PO TABS
75.0000 mg | ORAL_TABLET | Freq: Every day | ORAL | Status: DC
  Administered 2019-07-08: 15:00:00 75 mg via ORAL
  Filled 2019-07-08 (×2): qty 1

## 2019-07-08 MED ORDER — CHLORHEXIDINE GLUCONATE CLOTH 2 % EX PADS: 6.0000 | MEDICATED_PAD | Freq: Once | CUTANEOUS | Status: DC

## 2019-07-08 MED ORDER — LACTATED RINGERS IV SOLN
INTRAVENOUS | Status: DC | PRN
  Administered 2019-07-08 (×2): via INTRAVENOUS

## 2019-07-08 MED ORDER — ONDANSETRON HCL 4 MG/2ML IJ SOLN
INTRAMUSCULAR | Status: DC | PRN
  Administered 2019-07-08: 4 mg via INTRAVENOUS

## 2019-07-08 MED ORDER — OXYCODONE HCL 5 MG PO TABS
10.0000 mg | ORAL_TABLET | ORAL | Status: DC | PRN
  Administered 2019-07-08 – 2019-07-09 (×3): 10 mg via ORAL
  Filled 2019-07-08 (×3): qty 2

## 2019-07-08 MED ORDER — PROPOFOL 10 MG/ML IV BOLUS
INTRAVENOUS | Status: AC
  Filled 2019-07-08: qty 40

## 2019-07-08 MED ORDER — OXYCODONE HCL 5 MG PO TABS: 5.0000 mg | ORAL_TABLET | ORAL | Status: DC | PRN

## 2019-07-08 MED ORDER — POLYETHYLENE GLYCOL 3350 17 G PO PACK: 17.0000 g | PACK | Freq: Every day | ORAL | Status: DC | PRN

## 2019-07-08 MED ORDER — ONDANSETRON HCL 4 MG/2ML IJ SOLN: 4.0000 mg | Freq: Four times a day (QID) | INTRAMUSCULAR | Status: DC | PRN

## 2019-07-08 MED ORDER — ONDANSETRON HCL 4 MG/2ML IJ SOLN
INTRAMUSCULAR | Status: AC
  Filled 2019-07-08: qty 2

## 2019-07-08 MED ORDER — PHENOL 1.4 % MT LIQD: 1.0000 | OROMUCOSAL | Status: DC | PRN

## 2019-07-08 MED ORDER — BUPIVACAINE HCL (PF) 0.5 % IJ SOLN
INTRAMUSCULAR | Status: AC
  Filled 2019-07-08: qty 30

## 2019-07-08 MED ORDER — DIAZEPAM 5 MG PO TABS
5.0000 mg | ORAL_TABLET | Freq: Four times a day (QID) | ORAL | Status: DC | PRN
  Administered 2019-07-08 – 2019-07-09 (×3): 5 mg via ORAL
  Filled 2019-07-08 (×3): qty 1

## 2019-07-08 MED ORDER — LIDOCAINE-EPINEPHRINE 1 %-1:100000 IJ SOLN
INTRAMUSCULAR | Status: AC
  Filled 2019-07-08: qty 1

## 2019-07-08 MED ORDER — LIDOCAINE 2% (20 MG/ML) 5 ML SYRINGE
INTRAMUSCULAR | Status: AC
  Filled 2019-07-08: qty 5

## 2019-07-08 MED ORDER — ACETAMINOPHEN 650 MG RE SUPP: 650.0000 mg | RECTAL | Status: DC | PRN

## 2019-07-08 SURGICAL SUPPLY — 66 items
BAG DECANTER FOR FLEXI CONT (MISCELLANEOUS) ×3 IMPLANT
BASKET BONE COLLECTION (BASKET) IMPLANT
BENZOIN TINCTURE PRP APPL 2/3 (GAUZE/BANDAGES/DRESSINGS) IMPLANT
BLADE CLIPPER SURG (BLADE) IMPLANT
BLADE SURG 11 STRL SS (BLADE) ×3 IMPLANT
BUR MATCHSTICK NEURO 3.0 LAGG (BURR) ×3 IMPLANT
BUR PRECISION FLUTE 5.0 (BURR) ×3 IMPLANT
CANISTER SUCT 3000ML PPV (MISCELLANEOUS) ×3 IMPLANT
CLOSURE WOUND 1/2 X4 (GAUZE/BANDAGES/DRESSINGS)
CONT SPEC 4OZ CLIKSEAL STRL BL (MISCELLANEOUS) ×3 IMPLANT
COVER BACK TABLE 60X90IN (DRAPES) ×3 IMPLANT
DECANTER SPIKE VIAL GLASS SM (MISCELLANEOUS) ×3 IMPLANT
DERMABOND ADVANCED (GAUZE/BANDAGES/DRESSINGS) ×2
DERMABOND ADVANCED .7 DNX12 (GAUZE/BANDAGES/DRESSINGS) ×1 IMPLANT
DEVICE INTERBODY ELEVATE 23X8 (Cage) ×2 IMPLANT
DRAPE C-ARM 42X72 X-RAY (DRAPES) ×3 IMPLANT
DRAPE C-ARMOR (DRAPES) ×3 IMPLANT
DRAPE LAPAROTOMY 100X72X124 (DRAPES) ×3 IMPLANT
DRAPE SURG 17X23 STRL (DRAPES) ×3 IMPLANT
DURAPREP 26ML APPLICATOR (WOUND CARE) ×3 IMPLANT
ELECT BLADE 6.5 EXT (BLADE) ×3 IMPLANT
ELECT REM PT RETURN 9FT ADLT (ELECTROSURGICAL) ×3
ELECTRODE REM PT RTRN 9FT ADLT (ELECTROSURGICAL) ×1 IMPLANT
GAUZE 4X4 16PLY RFD (DISPOSABLE) IMPLANT
GAUZE SPONGE 4X4 12PLY STRL (GAUZE/BANDAGES/DRESSINGS) IMPLANT
GLOVE BIO SURGEON STRL SZ7.5 (GLOVE) ×6 IMPLANT
GLOVE BIOGEL M STER SZ 6 (GLOVE) ×3 IMPLANT
GLOVE BIOGEL PI IND STRL 7.5 (GLOVE) ×4 IMPLANT
GLOVE BIOGEL PI INDICATOR 7.5 (GLOVE) ×8
GLOVE ECLIPSE 9.0 STRL (GLOVE) ×3 IMPLANT
GLOVE SS N UNI LF 6.0 STRL (GLOVE) ×6 IMPLANT
GOWN STRL REUS W/ TWL LRG LVL3 (GOWN DISPOSABLE) ×4 IMPLANT
GOWN STRL REUS W/ TWL XL LVL3 (GOWN DISPOSABLE) IMPLANT
GOWN STRL REUS W/TWL 2XL LVL3 (GOWN DISPOSABLE) IMPLANT
GOWN STRL REUS W/TWL LRG LVL3 (GOWN DISPOSABLE) ×8
GOWN STRL REUS W/TWL XL LVL3 (GOWN DISPOSABLE)
GUIDEWIRE 18IN BLUNT CD HORIZ (WIRE) ×12 IMPLANT
HEMOSTAT POWDER KIT SURGIFOAM (HEMOSTASIS) ×3 IMPLANT
KIT BASIN OR (CUSTOM PROCEDURE TRAY) ×3 IMPLANT
KIT POSITION SURG JACKSON T1 (MISCELLANEOUS) ×3 IMPLANT
KIT TURNOVER KIT B (KITS) ×3 IMPLANT
NEEDLE BEVEL TWO-PAK W/1PK (NEEDLE) ×3 IMPLANT
NEEDLE HYPO 22GX1.5 SAFETY (NEEDLE) ×3 IMPLANT
NEEDLE SPNL 18GX3.5 QUINCKE PK (NEEDLE) ×3 IMPLANT
NS IRRIG 1000ML POUR BTL (IV SOLUTION) ×3 IMPLANT
PACK LAMINECTOMY NEURO (CUSTOM PROCEDURE TRAY) ×3 IMPLANT
PAD ARMBOARD 7.5X6 YLW CONV (MISCELLANEOUS) ×9 IMPLANT
PUTTY DBM GRAFTON 5CC (Putty) ×3 IMPLANT
ROD PERC CCM 5.5X35 (Rod) ×6 IMPLANT
SCREW 6.5X35 VOYAGER MAS FNS (Screw) ×6 IMPLANT
SCREW 6.5X40 VOYAGER MAS FNS (Screw) ×6 IMPLANT
SCREW SET 5.5/6.0MM SOLERA (Screw) ×12 IMPLANT
SPACER SPNL XLORDOTIC 23X8X (Cage) ×1 IMPLANT
SPCR SPNL XLORDOTIC 23X8X (Cage) ×1 IMPLANT
STRIP CLOSURE SKIN 1/2X4 (GAUZE/BANDAGES/DRESSINGS) IMPLANT
SUT MNCRL AB 3-0 PS2 18 (SUTURE) ×3 IMPLANT
SUT MON AB 3-0 SH 27 (SUTURE) ×2
SUT MON AB 3-0 SH27 (SUTURE) ×1 IMPLANT
SUT VIC AB 0 CT1 18XCR BRD8 (SUTURE) ×1 IMPLANT
SUT VIC AB 0 CT1 8-18 (SUTURE) ×2
SUT VIC AB 2-0 CP2 18 (SUTURE) ×6 IMPLANT
SYR 3ML LL SCALE MARK (SYRINGE) IMPLANT
TOWEL GREEN STERILE (TOWEL DISPOSABLE) ×3 IMPLANT
TOWEL GREEN STERILE FF (TOWEL DISPOSABLE) ×3 IMPLANT
TRAY FOLEY MTR SLVR 16FR STAT (SET/KITS/TRAYS/PACK) ×3 IMPLANT
WATER STERILE IRR 1000ML POUR (IV SOLUTION) ×3 IMPLANT

## 2019-07-08 NOTE — Anesthesia Procedure Notes (Signed)
Procedure Name: Intubation Date/Time: 07/08/2019 7:41 AM Performed by: Valda Favia, CRNA Pre-anesthesia Checklist: Patient identified, Emergency Drugs available, Suction available, Patient being monitored and Timeout performed Patient Re-evaluated:Patient Re-evaluated prior to induction Oxygen Delivery Method: Circle system utilized Preoxygenation: Pre-oxygenation with 100% oxygen Induction Type: IV induction Ventilation: Mask ventilation without difficulty and Oral airway inserted - appropriate to patient size Laryngoscope Size: Mac and 4 Grade View: Grade I Tube type: Oral Tube size: 7.0 mm Number of attempts: 1 Airway Equipment and Method: Stylet Placement Confirmation: ETT inserted through vocal cords under direct vision,  positive ETCO2 and breath sounds checked- equal and bilateral Secured at: 20 cm Tube secured with: Tape Dental Injury: Teeth and Oropharynx as per pre-operative assessment

## 2019-07-08 NOTE — Transfer of Care (Signed)
Immediate Anesthesia Transfer of Care Note  Patient: Carolyn Holmes  Procedure(s) Performed: Left Lumbar Five Sacral One minimally invasive transforaminal lumbar interbody fusion with bilateral Lumbar Five Sacral One pedicle screws (Left Spine Lumbar)  Patient Location: PACU  Anesthesia Type:General  Level of Consciousness: sedated  Airway & Oxygen Therapy: Patient Spontanous Breathing and Patient connected to face mask oxygen  Post-op Assessment: Report given to RN and Post -op Vital signs reviewed and stable  Post vital signs: Reviewed and stable  Last Vitals:  Vitals Value Taken Time  BP 127/54 07/08/19 1246  Temp 36.9 C 07/08/19 1245  Pulse 106 07/08/19 1252  Resp 23 07/08/19 1252  SpO2 96 % 07/08/19 1252  Vitals shown include unvalidated device data.  Last Pain:  Vitals:   07/08/19 1245  TempSrc:   PainSc: Asleep         Complications: No apparent anesthesia complications

## 2019-07-08 NOTE — Evaluation (Signed)
Physical Therapy Evaluation Patient Details Name: Carolyn Holmes MRN: [id number] DOB: Feb 17, 1951 Today's Date: 07/08/2019   History of Present Illness  Patient is a 68 y/o female who presents s/p left L5-S1 TLIF and complete left L5-S1 facetectomy with bilateral L5-S1 pedicle screw placement 8/17. PMH includes HTN.  Clinical Impression  Patient presents with pain and post surgical deficits s/p above surgery. Pt independent PTA and works at [employer]. Has support of spouse at home. Today, pt requires Min-mod A for bed mobility, transfers and gait training due to muscle spasms and pain. Reaches rail for support at times for balance. Would do well with RW during ambulation. Reports she can borrow one for home. Education re: back precautions, handout, walking program etc. Will follow acutely to maximize independence and mobility prior to return home.    Follow Up Recommendations Home health PT;Supervision - Intermittent    Equipment Recommendations  None recommended by PT    Recommendations for Other Services       Precautions / Restrictions Precautions Precautions: Back Precaution Booklet Issued: Yes (comment) Precaution Comments: Reviewed back precautions and handout Restrictions Weight Bearing Restrictions: No Other Position/Activity Restrictions: Orders say no brace needed. Per RN, brace has been ordered.      Mobility  Bed Mobility Overal bed mobility: Needs Assistance Bed Mobility: Rolling;Sidelying to Sit Rolling: Min guard Sidelying to sit: Min assist;HOB elevated       General bed mobility comments: Cues for log roll technique; use of rail and assist with trunk elevation; increased time.  Transfers Overall transfer level: Needs assistance Equipment used: None Transfers: Sit to/from Stand Sit to Stand: Mod assist;Min assist         General transfer comment: Assist to power to standing, slow to rise with pt walking hands up thighs and reaching for IV  pole for support. Stood from [employer], transferred to chair.  Ambulation/Gait Ambulation/Gait assistance: Min assist Gait Distance (Feet): 200 Feet Assistive device: None(rail at times) Gait Pattern/deviations: Step-through pattern;Decreased stride length;Narrow base of support Gait velocity: decreased   General Gait Details: Slow, guarded and unsteady gait, holding onto rail for support at times. Min A for balance. Would do well with Rw. Spasms in back.  Stairs            Wheelchair Mobility    Modified Rankin (Stroke Patients Only)       Balance Overall balance assessment: Needs assistance Sitting-balance support: Feet supported;No upper extremity supported Sitting balance-Leahy Scale: Fair     Standing balance support: During functional activity Standing balance-Leahy Scale: Poor Standing balance comment: Requires UE support or external support for balance.                             Pertinent Vitals/Pain Pain Assessment: 0-10 Pain Score: 6  Pain Location: right flank/back Pain Descriptors / Indicators: Spasm Pain Intervention(s): Repositioned;Monitored during session;Patient requesting pain meds-RN notified    Home Living Family/patient expects to be discharged to:: Private residence Living Arrangements: Spouse/significant other Available Help at Discharge: Family;Available 24 hours/day Type of Home: House Home Access: Stairs to enter Entrance Stairs-Rails: Right Entrance Stairs-Number of Steps: 5 Home Layout: One level Home Equipment: Walker - 2 wheels Additional Comments: Can borrow RW    Prior Function Level of Independence: Independent         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     Hand Dominance  Extremity/Trunk Assessment   Upper Extremity Assessment Upper Extremity Assessment: Defer to OT evaluation    Lower Extremity Assessment Lower Extremity Assessment: Generalized  weakness    Cervical / Trunk Assessment Cervical / Trunk Assessment: Other exceptions Cervical / Trunk Exceptions: s/p spine surgery  Communication   Communication: No difficulties  Cognition Arousal/Alertness: Awake/alert Behavior During Therapy: WFL for tasks assessed/performed Overall Cognitive Status: Within Functional Limits for tasks assessed                                        General Comments General comments (skin integrity, edema, etc.): Incision is clean, dry and intact.    Exercises     Assessment/Plan    PT Assessment Patient needs continued PT services  PT Problem List Decreased strength;Decreased mobility;Pain;Decreased balance;Decreased skin integrity;Decreased knowledge of use of DME;Decreased knowledge of precautions       PT Treatment Interventions DME instruction;Therapeutic activities;Patient/family education;Therapeutic exercise;Gait training;Stair training;Balance training;Functional mobility training;Neuromuscular re-education    PT Goals (Current goals can be found in the Care Plan section)  Acute Rehab PT Goals Patient Stated Goal: to get home ASAP PT Goal Formulation: With patient Time For Goal Achievement: 07/22/19 Potential to Achieve Goals: Fair    Frequency Min 5X/week   Barriers to discharge Inaccessible home environment stairs to enter home    Co-evaluation               AM-PAC PT "6 Clicks" Mobility  Outcome Measure Help needed turning from your back to your side while in a flat bed without using bedrails?: A Little Help needed moving from lying on your back to sitting on the side of a flat bed without using bedrails?: A Little Help needed moving to and from a bed to a chair (including a wheelchair)?: A Lot Help needed standing up from a chair using your arms (e.g., wheelchair or bedside chair)?: A Lot Help needed to walk in hospital room?: A Little Help needed climbing 3-5 steps with a railing? : A  Little 6 Click Score: 16    End of Session Equipment Utilized During Treatment: Gait belt Activity Tolerance: Patient tolerated treatment well Patient left: in chair;with call bell/phone within reach Nurse Communication: Mobility status PT Visit Diagnosis: Pain;Unsteadiness on feet (R26.81);Difficulty in walking, not elsewhere classified (R26.2) Pain - part of body: (back)    Time: 4503-8882 PT Time Calculation (min) (ACUTE ONLY): 22 min   Charges:   PT Evaluation $PT Eval Moderate Complexity: 1 Mod          Wray Kearns, PT, DPT Acute Rehabilitation Services Pager [phone] Office [phone]      [place] 07/08/2019, 3:24 PM

## 2019-07-08 NOTE — Op Note (Signed)
PATIENT: Carolyn Holmes  DAY OF SURGERY: 07/08/19   PRE-OPERATIVE DIAGNOSIS:  Lumbar radiculopathy, isthmic spondylolisthesis   POST-OPERATIVE DIAGNOSIS:  Lumbar radiculopathy, isthmic spondylolisthesis   PROCEDURE:  Left L5-S1 minimally invasive transforaminal lumbar interbody fusion, complete left L5-S1 facetectomy with bilateral L5-S1 pedicle screw placement   SURGEON:  Surgeon(s) and Role:    Judith Part, MD - Primary    Earnie Larsson, MD - Assisting   ANESTHESIA: ETGA   BRIEF HISTORY: This is a 68 year old woman who presented with medically refractory left L5 radiculopathy. 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 This was discussed with the patient as well as risks, benefits, and alternatives and wished to proceed with surgical treatment.   OPERATIVE DETAIL: The patient was taken to the operating room and placed on the OR table in the prone position. A formal time out was performed with two patient identifiers and confirmed the operative site. Anesthesia was induced by the anesthesia team. The operative site was marked, hair was clipped with surgical clippers, the area was then prepped and draped in a sterile fashion.   Using fluoroscopy, landmarks were identified and marked including the midline, the four pedicle insertion sites, and the disc space at 3cm lateral to midline. An incision was then created and serial dilators were used to dock onto the facet complex. Soft tissue was wanded off and a tubular retractor was placed and secured to the table. The operating microscope was draped in a sterile fashion and brought into the field. Landmarks were identified and then a complete facetectomy was performed on the left at L5-S1. The ligamentum flavum was removed and the foramen was identified along with the inferior and superior pedicles.   The epidural veins were cauterized and the traversing and exiting nerve roots were identified.  For the work in the disc space, a suction retractor was used to protect the traversing nerve root at all times with constant visualization of the exiting nerve root. An annulotomy and then discectomy were then performed with fluoroscopic assistance. The disc space and an interbody cage were packed with bone and the expandable interbody cage was then inserted into the disc space and expanded using fluoroscopy.  Attention was then turned to ipsilateral instrumentation. Using the same incision, fluoroscopy was used to guide pedicle screws into the L5 and S1 pedicles. Using a Jamshidi needle and fluoroscopy, the pedicle was cannulated and a K-wire was inserted. A cannulated tap was used followed by a cannulated pedicle screw ([employer]tronic). A rod was introduced to connect the two pedicle screws, which was confirmed on fluoroscopy, the construct was compressed, then final tightened. The tubular retractor was removed under microscopic visualization, obtaining hemostasis as it was removed.  Attention was then turned to contralateral instrumentation. A superficial incision was made to connect the two pedicle insertion sites. A Jamshidi needle was used with fluoroscopy to cannulate both pedicles, place a K-wire, then use cannulated taps followed by a cannulated pedicle screw. The fascial incision was expanded to allow for passage of the rod, which was confirmed on fluoroscopy then final tightened.   Hemostasis was confirmed, all instrument and sponge counts were correct, the bilateral incision were then irrigated copiously and closed in layers. The patient was then returned to anesthesia for emergence. No apparent complications at the completion of the procedure.   EBL:  122mL   DRAINS: none   SPECIMENS: none   Judith Part, MD 07/08/19 12:34 PM

## 2019-07-08 NOTE — H&P (Signed)
Surgical H&P Update  HPI: 68 y.o. woman with history of LLE radicular pain, unresponsive to non-surgical treatments, here for MSI TLIF. Radiographic workup revealed an L5-S1 spondylolisthesis with foraminal stenosis. No changes in health since she was last seen. Still having left lower extremity radicular pain and wishes to proceed with surgery.  PMHx:  Past Medical History:  Diagnosis Date  . Hypertension    FamHx: History reviewed. No pertinent family history. SocHx:  reports that she has been smoking cigarettes. She has a 25.00 pack-year smoking history. She has never used smokeless tobacco. She reports current alcohol use. She reports that she does not use drugs.  Physical Exam: AOx3, PERRL, FS, TM  Strength 5/5 x4, SILTx4 except L L5 numbness  Assesment/Plan: 68 y.o. woman with L L5 radic 2/2 L5-S1 spondy with foraminal stenosis, here for L L5-S1 TLIF. Risks, benefits, and alternatives discussed and the patient would like to continue with surgery.  -OR today -3C post-op  Judith Part, MD 07/08/19 7:21 AM

## 2019-07-08 NOTE — Brief Op Note (Signed)
07/08/2019  12:33 PM  PATIENT:  Carolyn Holmes  68 y.o. female  PRE-OPERATIVE DIAGNOSIS:  Isthmic spondylolisthesis  POST-OPERATIVE DIAGNOSIS:  Isthmic spondylolisthesis  PROCEDURE:  Procedure(s) with comments: Left Lumbar Five Sacral One minimally invasive transforaminal lumbar interbody fusion with bilateral Lumbar Five Sacral One pedicle screws (Left) - Left Lumbar Five Sacral One minimally invasive transforaminal lumbar interbody fusion with bilateral Lumbar Five Sacral One pedicle screws  SURGEON:  Surgeon(s) and Role:    * Khalib Fendley, Joyice Faster, MD - Primary    * Earnie Larsson, MD - Assisting  PHYSICIAN ASSISTANT:   ANESTHESIA:   general  EBL:  150 mL   BLOOD ADMINISTERED:none  DRAINS: none   LOCAL MEDICATIONS USED:  LIDOCAINE   SPECIMEN:  No Specimen  DISPOSITION OF SPECIMEN:  N/A  COUNTS:  YES  TOURNIQUET:  * No tourniquets in log *  DICTATION: .Note written in EPIC  PLAN OF CARE: Admit for overnight observation  PATIENT DISPOSITION:  PACU - hemodynamically stable.   Delay start of Pharmacological VTE agent (>24hrs) due to surgical blood loss or risk of bleeding: yes

## 2019-07-08 NOTE — Progress Notes (Signed)
Neurosurgery Service Post-operative progress note  Assessment & Plan: 68 y.o. woman s/p MIS TLIF, seen in PACU, still recovering but MAEx4 well.  -admit to [facility] -no brace needed, activity as tolerated  Carolyn Holmes A Carolyn Holmes  07/08/19 1:08 PM

## 2019-07-09 MED ORDER — DIAZEPAM 5 MG PO TABS: 5.0000 mg | ORAL_TABLET | Freq: Four times a day (QID) | ORAL | 0 refills | Status: DC | PRN

## 2019-07-09 MED ORDER — OXYCODONE HCL 5 MG PO TABS: 5.0000 mg | ORAL_TABLET | ORAL | 0 refills | Status: DC | PRN

## 2019-07-09 NOTE — Discharge Instructions (Signed)
Discharge Instructions ° °No restriction in activities, slowly increase your activity back to normal.  ° °Your incision is closed with dermabond (purple glue). This will naturally fall off over the next 1-2 weeks.  ° °Okay to shower on the day of discharge. Use regular soap and water and try to be gentle when cleaning your incision.  ° °Follow up with Dr. Rechy Bost in 2 weeks after discharge. If you do not already have a discharge appointment, please call his office at [phone] to schedule a follow up appointment. If you have any concerns or questions, please call the office and let us know. °

## 2019-07-09 NOTE — Progress Notes (Signed)
Pt doing well. Pt given D/C instructions with verbal understanding. Rx's were sent to pharmacy by MD. Pt's incision is clean and dry with no sign of infection. Pt's IV was removed prior to D/C. Pt D/C'd home via wheelchair per MD order. Pt is stable @ D/C and has no other needs at this time. Mariko Nowakowski, RN  

## 2019-07-09 NOTE — Evaluation (Addendum)
Occupational Therapy Evaluation and Discharge Patient Details Name: Carolyn Holmes MRN: [id number] DOB: [birth year] Today's Date: 07/09/2019    History of Present Illness Patient is a 68 y/o female who presents s/p left L5-S1 TLIF and complete left L5-S1 facetectomy with bilateral L5-S1 pedicle screw placement 8/17. PMH includes HTN.   Clinical Impression   PTA patient independent, admitted for above and limited by problem list below including pain, decreased activity tolerance, impaired balance. Educated on brace mgmt and use, precautions, ADL compensatory techniques, and recommendations.  She is able to complete UB ADLs with setup assist, LB ADLs with supervision using figure 4 technique, and transfers with close supervision but reliant on UE support.  She completes in room mobility with close supervision, preference to UE support.  She reports she will have support from her spouse, has a shower chair for her shower.  Based on performance today, no further needs identified and OT will sign off. Thank you for this referral.      Follow Up Recommendations  No OT follow up;Supervision/Assistance - 24 hour    Equipment Recommendations  None recommended by OT    Recommendations for Other Services       Precautions / Restrictions Precautions Precautions: Back Precaution Booklet Issued: Yes (comment) Precaution Comments: Reviewed back precautions and handout Required Braces or Orthoses: Spinal Brace Spinal Brace: Lumbar corset;Applied in sitting position Restrictions Weight Bearing Restrictions: No Other Position/Activity Restrictions: Orders say no brace needed. But patient has brace in room.       Mobility Bed Mobility               General bed mobility comments: OOB in chair upon entry  Transfers Overall transfer level: Needs assistance Equipment used: None Transfers: Sit to/from Stand Sit to Stand: Supervision         General transfer comment: close supervision  for safety, relaint on UE support with min cueing for posture     Balance Overall balance assessment: Needs assistance Sitting-balance support: No upper extremity supported;Feet supported Sitting balance-Leahy Scale: Good     Standing balance support: Single extremity supported;No upper extremity supported;During functional activity Standing balance-Leahy Scale: Poor Standing balance comment: patient reaching out for UB support dynamically, close supervision                            ADL either performed or assessed with clinical judgement   ADL Overall ADL's : Needs assistance/impaired     Grooming: Supervision/safety;Standing   Upper Body Bathing: Set up;Sitting   Lower Body Bathing: Supervison/ safety;Sit to/from stand;Cueing for compensatory techniques;Adhering to back precautions Lower Body Bathing Details (indicate cue type and reason): utilized figure 4 technique with increased time, verbalizes understanding of lateral lean technique  Upper Body Dressing : Set up;Sitting;Supervision/safety Upper Body Dressing Details (indicate cue type and reason): donning brace and shirt with setup assist Lower Body Dressing: Supervision/safety;Set up;Sit to/from stand;Cueing for compensatory techniques;Cueing for back precautions Lower Body Dressing Details (indicate cue type and reason): utilized figure 4 technique with supervision, increased time; educated on safety, compensatory techniques (L LE first) and safety  Toilet Transfer: Supervision/safety;Ambulation Toilet Transfer Details (indicate cue type and reason): close supervision for safety, relaint on UE support to stand     Tub/ Shower Transfer: Walk-in shower;Supervision/safety;Ambulation;Shower Scientist, research (medical) Details (indicate cue type and reason): simlated side stepping shower over threshold with close supervision Functional mobility during ADLs: Supervision/safety General ADL Comments: pt limited by pain,  utilizes compensatory techniques well      Vision         Perception     Praxis      Pertinent Vitals/Pain Pain Assessment: Faces Faces Pain Scale: Hurts little more Pain Location: incisional, back with mobility Pain Descriptors / Indicators: Discomfort;Operative site guarding Pain Intervention(s): Monitored during session;Repositioned     Hand Dominance     Extremity/Trunk Assessment Upper Extremity Assessment Upper Extremity Assessment: Overall WFL for tasks assessed   Lower Extremity Assessment Lower Extremity Assessment: Defer to PT evaluation   Cervical / Trunk Assessment Cervical / Trunk Assessment: Other exceptions Cervical / Trunk Exceptions: s/p spine surgery   Communication Communication Communication: No difficulties   Cognition Arousal/Alertness: Awake/alert Behavior During Therapy: WFL for tasks assessed/performed Overall Cognitive Status: Within Functional Limits for tasks assessed                                     General Comments       Exercises     Shoulder Instructions      Home Living Family/patient expects to be discharged to:: Private residence Living Arrangements: Spouse/significant other Available Help at Discharge: Family;Available 24 hours/day Type of Home: House Home Access: Stairs to enter [employer] of Steps: 5 Entrance Stairs-Rails: Right Home Layout: One level     Bathroom Shower/Tub: Walk-in shower;Tub/shower unit   Bathroom Toilet: Standard     Home Equipment: Environmental consultant - 2 wheels;Shower seat   Additional Comments: Can borrow RW      Prior Functioning/Environment Level of Independence: Independent        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        OT Problem List: Decreased strength;Decreased activity tolerance;Impaired balance (sitting and/or standing);Decreased knowledge of precautions;Pain;Decreased knowledge of use of DME or  AE;Decreased safety awareness      OT Treatment/Interventions:      OT Goals(Current goals can be found in the care plan section) Acute Rehab OT Goals Patient Stated Goal: to get home ASAP OT Goal Formulation: With patient Time For Goal Achievement: 07/09/19  OT Frequency:     Barriers to D/C:            Co-evaluation              AM-PAC OT "6 Clicks" Daily Activity     Outcome Measure Help from another person eating meals?: None Help from another person taking care of personal grooming?: A Little Help from another person toileting, which includes using toliet, bedpan, or urinal?: A Little Help from another person bathing (including washing, rinsing, drying)?: A Little Help from another person to put on and taking off regular upper body clothing?: A Little Help from another person to put on and taking off regular lower body clothing?: A Little 6 Click Score: 19   End of Session Equipment Utilized During Treatment: Back brace Nurse Communication: Mobility status  Activity Tolerance: Patient tolerated treatment well Patient left: in chair;with call bell/phone within reach  OT Visit Diagnosis: Other abnormalities of gait and mobility (R26.89);Muscle weakness (generalized) (M62.81);Pain Pain - part of body: (back-incisonal)                Time: 0539-7673 OT Time Calculation (min): 20 min Charges:  OT General Charges $OT Visit: 1 Visit OT Evaluation $OT Eval Low Complexity: 1 Low  Delight Stare, OT Acute Rehabilitation Services  Pager [phone] Office [place] 07/09/2019, 9:09 AM

## 2019-07-09 NOTE — Discharge Summary (Signed)
Discharge Summary  Date of Admission: 07/08/2019  Date of Discharge: 07/09/19  Attending Physician: Emelda Brothers, MD  Hospital Course: Patient was admitted following an uncomplicated Z6-X0 TLIF. She was recovered in PACU and transferred to [facility]. Her left leg pain completely resolved post-op and her hospital course was uncomplicated. The patient was discharged home on 07/09/2019. She will follow up in clinic with me in 2 weeks.  Neurologic exam at discharge:  AOx3, PERRL, EOMI, FS, TM Strength 5/5 x4, SILTx4  Discharge diagnosis: Lumbar spondylolisthesis  Judith Part, MD 07/09/19 8:19 AM

## 2019-07-09 NOTE — Progress Notes (Signed)
Physical Therapy Treatment Patient Details Name: Carolyn Holmes MRN: [id number] DOB: 11-05-1951 Today's Date: 07/09/2019    History of Present Illness Patient is a 68 y/o female who presents s/p left L5-S1 TLIF and complete left L5-S1 facetectomy with bilateral L5-S1 pedicle screw placement 8/17. PMH includes HTN.    PT Comments    Pt progressing towards physical therapy goals. Continues to require UE support during ambulation however reluctant to use RW. Even with SPC pt still requiring support with free UE for balance. Educated on max safety at home and pt agreeable to RW use at home. Pt was also educated on car transfer and activity progression upon d/c. Will continue to follow.     Follow Up Recommendations  Home health PT;Supervision - Intermittent     Equipment Recommendations  None recommended by PT    Recommendations for Other Services       Precautions / Restrictions Precautions Precautions: Back Precaution Booklet Issued: Yes (comment) Precaution Comments: Reviewed back precautions and handout Required Braces or Orthoses: Spinal Brace Spinal Brace: Lumbar corset;Applied in sitting position Restrictions Weight Bearing Restrictions: No Other Position/Activity Restrictions: Orders say no brace needed. But patient has brace in room.     Mobility  Bed Mobility               General bed mobility comments: OOB in chair upon entry  Transfers Overall transfer level: Needs assistance Equipment used: None Transfers: Sit to/from Stand Sit to Stand: Supervision         General transfer comment: close supervision for safety, relaint on UE support with min cueing for posture   Ambulation/Gait Ambulation/Gait assistance: Min assist Gait Distance (Feet): 200 Feet Assistive device: 1 person hand held assist;Straight cane Gait Pattern/deviations: Step-through pattern;Decreased stride length;Narrow base of support Gait velocity: decreased Gait velocity  interpretation: <1.31 ft/sec, indicative of household ambulator General Gait Details: Slow and guarded with consistent use of rail or HHA for support. Reluctant to use RW but needs BUE support   Marine scientist Rankin (Stroke Patients Only)       Balance Overall balance assessment: Needs assistance Sitting-balance support: No upper extremity supported;Feet supported Sitting balance-Leahy Scale: Good     Standing balance support: Single extremity supported;No upper extremity supported;During functional activity Standing balance-Leahy Scale: Poor Standing balance comment: patient reaching out for UB support dynamically, close supervision                             Cognition Arousal/Alertness: Awake/alert Behavior During Therapy: WFL for tasks assessed/performed Overall Cognitive Status: Within Functional Limits for tasks assessed                                        Exercises      General Comments        Pertinent Vitals/Pain Pain Assessment: Faces Faces Pain Scale: Hurts little more Pain Location: incisional, back with mobility Pain Descriptors / Indicators: Discomfort;Operative site guarding Pain Intervention(s): Monitored during session    Home Living Family/patient expects to be discharged to:: Private residence Living Arrangements: Spouse/significant other Available Help at Discharge: Family;Available 24 hours/day Type of Home: House Home Access: Stairs to enter Entrance Stairs-Rails: Right Home Layout: One level Home Equipment: Walker - 2 wheels;Shower seat Additional  Comments: Can borrow RW    Prior Function Level of Independence: Independent      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   PT Goals (current goals can now be found in the care plan section) Acute Rehab PT Goals Patient Stated Goal: to get home ASAP PT Goal Formulation: With  patient Time For Goal Achievement: 07/22/19 Potential to Achieve Goals: Fair Progress towards PT goals: Progressing toward goals    Frequency    Min 5X/week      PT Plan Current plan remains appropriate    Co-evaluation              AM-PAC PT "6 Clicks" Mobility   Outcome Measure  Help needed turning from your back to your side while in a flat bed without using bedrails?: A Little Help needed moving from lying on your back to sitting on the side of a flat bed without using bedrails?: A Little Help needed moving to and from a bed to a chair (including a wheelchair)?: A Lot Help needed standing up from a chair using your arms (e.g., wheelchair or bedside chair)?: A Lot Help needed to walk in hospital room?: A Little Help needed climbing 3-5 steps with a railing? : A Little 6 Click Score: 16    End of Session Equipment Utilized During Treatment: Gait belt Activity Tolerance: Patient tolerated treatment well Patient left: in chair;with call bell/phone within reach Nurse Communication: Mobility status PT Visit Diagnosis: Pain;Unsteadiness on feet (R26.81);Difficulty in walking, not elsewhere classified (R26.2) Pain - part of body: (back)     Time: 3159-4585 PT Time Calculation (min) (ACUTE ONLY): 16 min  Charges:  $Gait Training: 8-22 mins                     Rolinda Roan, PT, DPT Acute Rehabilitation Services Pager: [phone] Office: [phone]    Thelma Comp 07/09/2019, 12:58 PM

## 2019-07-09 NOTE — Anesthesia Postprocedure Evaluation (Signed)
Anesthesia Post Note  Patient: Carolyn Holmes  Procedure(s) Performed: Left Lumbar Five Sacral One minimally invasive transforaminal lumbar interbody fusion with bilateral Lumbar Five Sacral One pedicle screws (Left Spine Lumbar)     Patient location during evaluation: PACU Anesthesia Type: General Level of consciousness: awake and alert Pain management: pain level controlled Vital Signs Assessment: post-procedure vital signs reviewed and stable Respiratory status: spontaneous breathing, nonlabored ventilation and respiratory function stable Cardiovascular status: blood pressure returned to baseline and stable Postop Assessment: no apparent nausea or vomiting Anesthetic complications: no    Last Vitals:  Vitals:   07/09/19 0422 07/09/19 0720  BP: 128/60 121/68  Pulse: 96 93  Resp: 18 16  Temp: 36.7 C 36.7 C  SpO2: 99% 95%    Last Pain:  Vitals:   07/09/19 0720  TempSrc: Oral  PainSc:                  [name],W. EDMOND

## 2019-07-10 ENCOUNTER — Encounter (HOSPITAL_COMMUNITY): Payer: Self-pay | Admitting: Neurological Surgery

## 2019-07-30 DIAGNOSIS — M4310 Spondylolisthesis, site unspecified: Secondary | ICD-10-CM | POA: Diagnosis not present

## 2019-09-08 DIAGNOSIS — I10 Essential (primary) hypertension: Secondary | ICD-10-CM | POA: Diagnosis not present

## 2019-09-08 DIAGNOSIS — R7301 Impaired fasting glucose: Secondary | ICD-10-CM | POA: Diagnosis not present

## 2019-09-08 DIAGNOSIS — R739 Hyperglycemia, unspecified: Secondary | ICD-10-CM | POA: Diagnosis not present

## 2019-09-08 DIAGNOSIS — E782 Mixed hyperlipidemia: Secondary | ICD-10-CM | POA: Diagnosis not present

## 2019-09-09 DIAGNOSIS — K219 Gastro-esophageal reflux disease without esophagitis: Secondary | ICD-10-CM | POA: Diagnosis not present

## 2019-09-09 DIAGNOSIS — Z7289 Other problems related to lifestyle: Secondary | ICD-10-CM | POA: Diagnosis not present

## 2019-09-09 DIAGNOSIS — Z87891 Personal history of nicotine dependence: Secondary | ICD-10-CM | POA: Diagnosis not present

## 2019-09-10 DIAGNOSIS — Z720 Tobacco use: Secondary | ICD-10-CM | POA: Diagnosis not present

## 2019-09-10 DIAGNOSIS — E782 Mixed hyperlipidemia: Secondary | ICD-10-CM | POA: Diagnosis not present

## 2019-09-10 DIAGNOSIS — Z23 Encounter for immunization: Secondary | ICD-10-CM | POA: Diagnosis not present

## 2019-09-10 DIAGNOSIS — I10 Essential (primary) hypertension: Secondary | ICD-10-CM | POA: Diagnosis not present

## 2019-09-10 DIAGNOSIS — Z6825 Body mass index (BMI) 25.0-25.9, adult: Secondary | ICD-10-CM | POA: Diagnosis not present

## 2019-09-10 DIAGNOSIS — F325 Major depressive disorder, single episode, in full remission: Secondary | ICD-10-CM | POA: Diagnosis not present

## 2019-09-10 DIAGNOSIS — R7301 Impaired fasting glucose: Secondary | ICD-10-CM | POA: Diagnosis not present

## 2019-09-10 DIAGNOSIS — R05 Cough: Secondary | ICD-10-CM | POA: Diagnosis not present

## 2020-03-08 DIAGNOSIS — E782 Mixed hyperlipidemia: Secondary | ICD-10-CM | POA: Diagnosis not present

## 2020-03-08 DIAGNOSIS — I10 Essential (primary) hypertension: Secondary | ICD-10-CM | POA: Diagnosis not present

## 2020-03-08 DIAGNOSIS — R7301 Impaired fasting glucose: Secondary | ICD-10-CM | POA: Diagnosis not present

## 2020-03-08 DIAGNOSIS — E7800 Pure hypercholesterolemia, unspecified: Secondary | ICD-10-CM | POA: Diagnosis not present

## 2020-03-08 DIAGNOSIS — Z720 Tobacco use: Secondary | ICD-10-CM | POA: Diagnosis not present

## 2020-03-15 DIAGNOSIS — R05 Cough: Secondary | ICD-10-CM | POA: Diagnosis not present

## 2020-03-15 DIAGNOSIS — Z720 Tobacco use: Secondary | ICD-10-CM | POA: Diagnosis not present

## 2020-03-15 DIAGNOSIS — E782 Mixed hyperlipidemia: Secondary | ICD-10-CM | POA: Diagnosis not present

## 2020-03-15 DIAGNOSIS — I10 Essential (primary) hypertension: Secondary | ICD-10-CM | POA: Diagnosis not present

## 2020-03-15 DIAGNOSIS — Z1389 Encounter for screening for other disorder: Secondary | ICD-10-CM | POA: Diagnosis not present

## 2020-03-15 DIAGNOSIS — F325 Major depressive disorder, single episode, in full remission: Secondary | ICD-10-CM | POA: Diagnosis not present

## 2020-03-15 DIAGNOSIS — R7301 Impaired fasting glucose: Secondary | ICD-10-CM | POA: Diagnosis not present

## 2020-03-15 DIAGNOSIS — Z1331 Encounter for screening for depression: Secondary | ICD-10-CM | POA: Diagnosis not present

## 2020-04-12 DIAGNOSIS — Z6825 Body mass index (BMI) 25.0-25.9, adult: Secondary | ICD-10-CM | POA: Diagnosis not present

## 2020-04-12 DIAGNOSIS — R300 Dysuria: Secondary | ICD-10-CM | POA: Diagnosis not present

## 2020-08-12 DIAGNOSIS — Z20828 Contact with and (suspected) exposure to other viral communicable diseases: Secondary | ICD-10-CM | POA: Diagnosis not present

## 2020-08-12 DIAGNOSIS — M928 Other specified juvenile osteochondrosis: Secondary | ICD-10-CM | POA: Diagnosis not present

## 2020-08-12 DIAGNOSIS — J441 Chronic obstructive pulmonary disease with (acute) exacerbation: Secondary | ICD-10-CM | POA: Diagnosis not present

## 2020-08-23 DIAGNOSIS — E782 Mixed hyperlipidemia: Secondary | ICD-10-CM | POA: Diagnosis not present

## 2020-08-23 DIAGNOSIS — I10 Essential (primary) hypertension: Secondary | ICD-10-CM | POA: Diagnosis not present

## 2020-08-23 DIAGNOSIS — E7800 Pure hypercholesterolemia, unspecified: Secondary | ICD-10-CM | POA: Diagnosis not present

## 2020-08-23 DIAGNOSIS — R739 Hyperglycemia, unspecified: Secondary | ICD-10-CM | POA: Diagnosis not present

## 2020-08-23 DIAGNOSIS — Z720 Tobacco use: Secondary | ICD-10-CM | POA: Diagnosis not present

## 2020-08-30 DIAGNOSIS — Z6826 Body mass index (BMI) 26.0-26.9, adult: Secondary | ICD-10-CM | POA: Diagnosis not present

## 2020-08-30 DIAGNOSIS — I10 Essential (primary) hypertension: Secondary | ICD-10-CM | POA: Diagnosis not present

## 2020-08-30 DIAGNOSIS — Z23 Encounter for immunization: Secondary | ICD-10-CM | POA: Diagnosis not present

## 2020-08-30 DIAGNOSIS — Z720 Tobacco use: Secondary | ICD-10-CM | POA: Diagnosis not present

## 2020-08-30 DIAGNOSIS — F325 Major depressive disorder, single episode, in full remission: Secondary | ICD-10-CM | POA: Diagnosis not present

## 2020-08-30 DIAGNOSIS — R7301 Impaired fasting glucose: Secondary | ICD-10-CM | POA: Diagnosis not present

## 2020-08-30 DIAGNOSIS — E782 Mixed hyperlipidemia: Secondary | ICD-10-CM | POA: Diagnosis not present

## 2020-10-03 DIAGNOSIS — Z23 Encounter for immunization: Secondary | ICD-10-CM | POA: Diagnosis not present

## 2020-10-04 DIAGNOSIS — M7661 Achilles tendinitis, right leg: Secondary | ICD-10-CM | POA: Diagnosis not present

## 2020-10-04 DIAGNOSIS — M79671 Pain in right foot: Secondary | ICD-10-CM | POA: Diagnosis not present

## 2020-11-14 IMAGING — RF LUMBAR SPINE - 2-3 VIEW
1 series · 2 of 2 positions shown · non-contrast
Comparison: None.

CLINICAL DATA: L5-S1 fusion

EXAM:
DG C-ARM 61-120 MIN; LUMBAR SPINE - 2-3 VIEW

[Series 1: run · 2 of 2 slices shown]
[im 1/2]
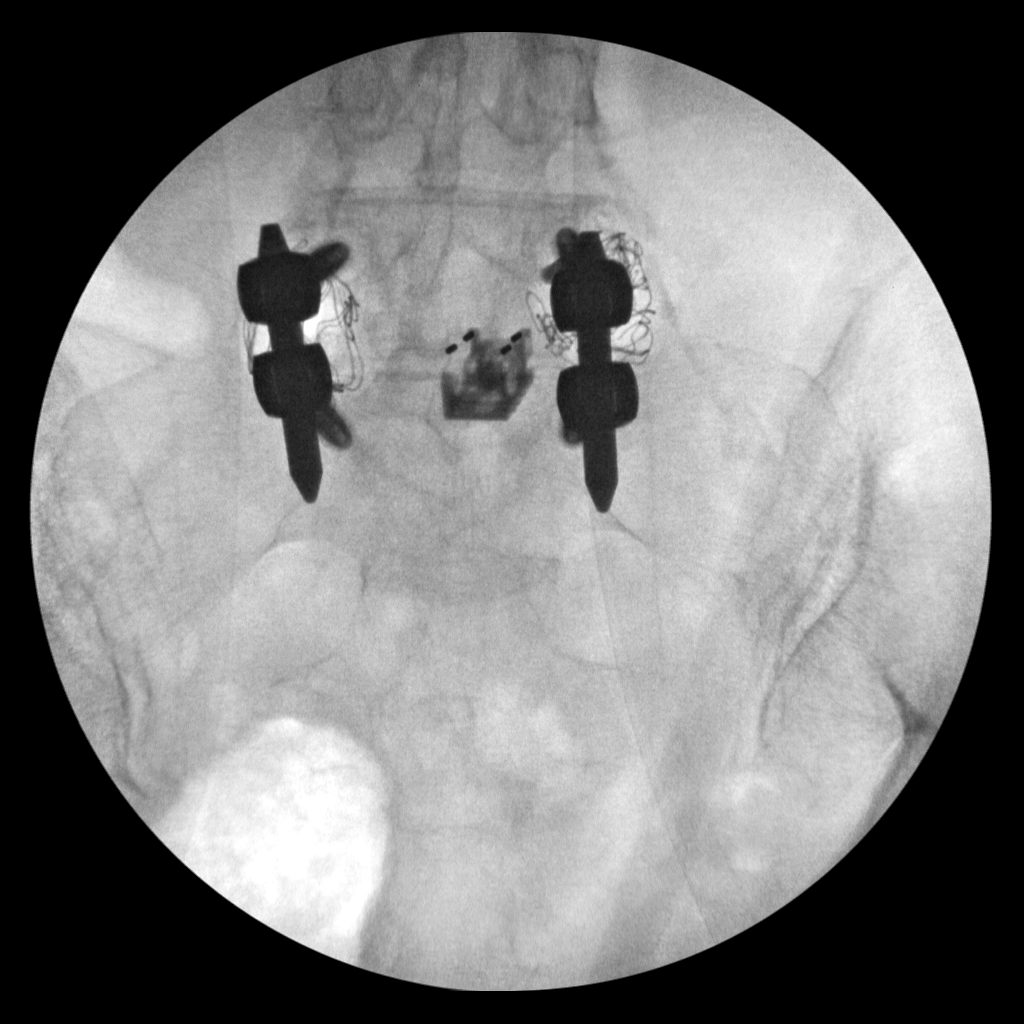
[im 2/2]
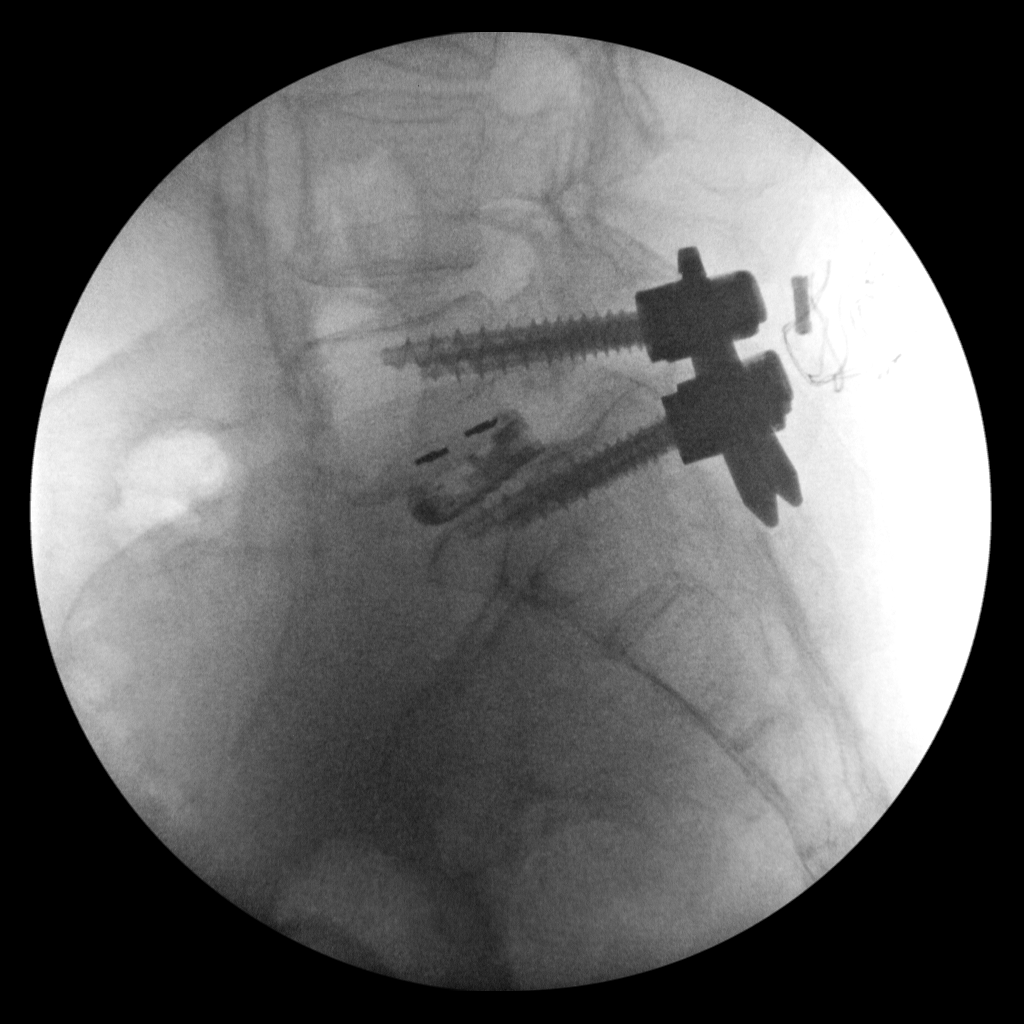

[2 of 2 positions shown; findings below may reference images not displayed]

FINDINGS: Posterior fusion changes noted at L5-S1. Normal alignment. No
visible complicating feature.
IMPRESSION: Posterior fusion L5-S1.  No visible complicating feature.

## 2021-02-10 DIAGNOSIS — M545 Low back pain, unspecified: Secondary | ICD-10-CM | POA: Diagnosis not present

## 2021-02-10 DIAGNOSIS — Z6827 Body mass index (BMI) 27.0-27.9, adult: Secondary | ICD-10-CM | POA: Diagnosis not present

## 2021-02-18 DIAGNOSIS — E7801 Familial hypercholesterolemia: Secondary | ICD-10-CM | POA: Diagnosis not present

## 2021-02-18 DIAGNOSIS — E7800 Pure hypercholesterolemia, unspecified: Secondary | ICD-10-CM | POA: Diagnosis not present

## 2021-02-18 DIAGNOSIS — E7849 Other hyperlipidemia: Secondary | ICD-10-CM | POA: Diagnosis not present

## 2021-02-18 DIAGNOSIS — R739 Hyperglycemia, unspecified: Secondary | ICD-10-CM | POA: Diagnosis not present

## 2021-02-18 DIAGNOSIS — I10 Essential (primary) hypertension: Secondary | ICD-10-CM | POA: Diagnosis not present

## 2021-02-18 DIAGNOSIS — E782 Mixed hyperlipidemia: Secondary | ICD-10-CM | POA: Diagnosis not present

## 2021-02-24 DIAGNOSIS — Z0001 Encounter for general adult medical examination with abnormal findings: Secondary | ICD-10-CM | POA: Diagnosis not present

## 2021-02-24 DIAGNOSIS — F325 Major depressive disorder, single episode, in full remission: Secondary | ICD-10-CM | POA: Diagnosis not present

## 2021-02-24 DIAGNOSIS — I10 Essential (primary) hypertension: Secondary | ICD-10-CM | POA: Diagnosis not present

## 2021-02-24 DIAGNOSIS — E7849 Other hyperlipidemia: Secondary | ICD-10-CM | POA: Diagnosis not present

## 2021-02-24 DIAGNOSIS — R7301 Impaired fasting glucose: Secondary | ICD-10-CM | POA: Diagnosis not present

## 2021-02-24 DIAGNOSIS — Z6826 Body mass index (BMI) 26.0-26.9, adult: Secondary | ICD-10-CM | POA: Diagnosis not present

## 2021-02-24 DIAGNOSIS — Z720 Tobacco use: Secondary | ICD-10-CM | POA: Diagnosis not present

## 2021-03-03 DIAGNOSIS — Z1211 Encounter for screening for malignant neoplasm of colon: Secondary | ICD-10-CM | POA: Diagnosis not present

## 2021-03-15 DIAGNOSIS — Z23 Encounter for immunization: Secondary | ICD-10-CM | POA: Diagnosis not present

## 2021-03-28 DIAGNOSIS — F17210 Nicotine dependence, cigarettes, uncomplicated: Secondary | ICD-10-CM | POA: Diagnosis not present

## 2021-03-28 DIAGNOSIS — I10 Essential (primary) hypertension: Secondary | ICD-10-CM | POA: Diagnosis not present

## 2021-03-28 DIAGNOSIS — Z538 Procedure and treatment not carried out for other reasons: Secondary | ICD-10-CM | POA: Diagnosis not present

## 2021-03-28 DIAGNOSIS — K635 Polyp of colon: Secondary | ICD-10-CM | POA: Diagnosis not present

## 2021-03-28 DIAGNOSIS — Z79899 Other long term (current) drug therapy: Secondary | ICD-10-CM | POA: Diagnosis not present

## 2021-03-28 DIAGNOSIS — K5730 Diverticulosis of large intestine without perforation or abscess without bleeding: Secondary | ICD-10-CM | POA: Diagnosis not present

## 2021-03-28 DIAGNOSIS — K621 Rectal polyp: Secondary | ICD-10-CM | POA: Diagnosis not present

## 2021-03-28 DIAGNOSIS — Z885 Allergy status to narcotic agent status: Secondary | ICD-10-CM | POA: Diagnosis not present

## 2021-03-28 DIAGNOSIS — E785 Hyperlipidemia, unspecified: Secondary | ICD-10-CM | POA: Diagnosis not present

## 2021-03-28 DIAGNOSIS — K644 Residual hemorrhoidal skin tags: Secondary | ICD-10-CM | POA: Diagnosis not present

## 2021-03-28 DIAGNOSIS — Z1211 Encounter for screening for malignant neoplasm of colon: Secondary | ICD-10-CM | POA: Diagnosis not present

## 2021-04-07 ENCOUNTER — Telehealth: Payer: Self-pay | Admitting: Gastroenterology

## 2021-04-07 NOTE — Telephone Encounter (Signed)
Good afternoon Dr. Tarri Glenn, we received a referral from [facility] for patient to have a colonoscopy.  Because of concern for aspiration, patient had an incomplete procedure on 03/28/2021.  Records will be sent to you.  Can you please advise on scheduling?  Thank you.

## 2021-04-07 NOTE — Telephone Encounter (Signed)
Records reviewed in Epic. Please obtain a copy of the colonoscopy report so that I can review the photographs. Thank you.

## 2021-04-11 NOTE — Telephone Encounter (Signed)
Amy with [facility] called inquiring about referral status. She will fax over colon report, she was not sure about images but will send what she has available.

## 2021-04-12 ENCOUNTER — Other Ambulatory Visit: Payer: Self-pay

## 2021-04-12 DIAGNOSIS — Z1211 Encounter for screening for malignant neoplasm of colon: Secondary | ICD-10-CM

## 2021-04-12 NOTE — Telephone Encounter (Signed)
Good moring Carolyn Holmes, can you help with scheduling this patient at the hospital please?  Thank you.

## 2021-04-12 NOTE — Telephone Encounter (Signed)
Records reviewed including colonoscopy report but there are no images.  Okay to schedule colonoscopy with me at the hospital.  Thanks.

## 2021-04-12 NOTE — Telephone Encounter (Signed)
Pt scheduled for previsit 05/09/21 at 3:30pm. Colon scheduled at [facility] 06/06/21@8 :30am. Case# [id number]. Pt aware of appts.

## 2021-05-09 ENCOUNTER — Ambulatory Visit (AMBULATORY_SURGERY_CENTER): Payer: Medicare Other

## 2021-05-09 ENCOUNTER — Other Ambulatory Visit: Payer: Self-pay

## 2021-05-09 VITALS — Ht 62.0 in | Wt 138.0 lb

## 2021-05-09 DIAGNOSIS — Z1211 Encounter for screening for malignant neoplasm of colon: Secondary | ICD-10-CM

## 2021-05-09 NOTE — Progress Notes (Signed)
Patient is here in-person for PV. Patient denies any allergies to eggs or soy. Patient denies any problems with anesthesia/sedation. Patient denies any oxygen use at home. Patient denies taking any diet/weight loss medications or blood thinners. Patient is not being treated for MRSA or C-diff. Patient is aware of our care-partner policy and [id number]-[id number] safety protocol. education assigned to the patient for the procedure, sent to MyChart.

## 2021-05-10 DIAGNOSIS — Z01419 Encounter for gynecological examination (general) (routine) without abnormal findings: Secondary | ICD-10-CM | POA: Diagnosis not present

## 2021-05-10 DIAGNOSIS — Z0000 Encounter for general adult medical examination without abnormal findings: Secondary | ICD-10-CM | POA: Diagnosis not present

## 2021-05-10 DIAGNOSIS — Z124 Encounter for screening for malignant neoplasm of cervix: Secondary | ICD-10-CM | POA: Diagnosis not present

## 2021-05-17 ENCOUNTER — Telehealth: Payer: Self-pay | Admitting: Gastroenterology

## 2021-05-17 NOTE — Telephone Encounter (Signed)
Patient wanted to make sure she can have the vaginal hysterectomy 2 days after colonoscopy- I explained it should be fine but to ask her surgeon to make sure they are ok with this. She states she will tomorrow.

## 2021-05-17 NOTE — Telephone Encounter (Signed)
Inbound call from patient requesting a call from a nurse please.  Has questions about procedure.

## 2021-06-01 ENCOUNTER — Other Ambulatory Visit: Payer: Self-pay

## 2021-06-01 ENCOUNTER — Encounter (HOSPITAL_COMMUNITY): Payer: Self-pay | Admitting: Gastroenterology

## 2021-06-06 ENCOUNTER — Encounter (HOSPITAL_COMMUNITY): Payer: Self-pay | Admitting: Gastroenterology

## 2021-06-06 ENCOUNTER — Ambulatory Visit (HOSPITAL_COMMUNITY): Payer: Medicare Other | Admitting: Anesthesiology

## 2021-06-06 ENCOUNTER — Encounter (HOSPITAL_COMMUNITY)
Admission: RE | Disposition: A | Discharge status: Home / Self Care | Payer: Self-pay | Source: Home / Self Care | Attending: Gastroenterology

## 2021-06-06 ENCOUNTER — Other Ambulatory Visit: Payer: Self-pay

## 2021-06-06 ENCOUNTER — Ambulatory Visit (HOSPITAL_COMMUNITY)
Admission: RE | Admit: 2021-06-06 | Discharge: 2021-06-06 | Disposition: A | Discharge status: Home / Self Care | Payer: Medicare Other | Attending: Gastroenterology | Admitting: Gastroenterology

## 2021-06-06 DIAGNOSIS — D122 Benign neoplasm of ascending colon: Secondary | ICD-10-CM | POA: Diagnosis not present

## 2021-06-06 DIAGNOSIS — K635 Polyp of colon: Secondary | ICD-10-CM | POA: Diagnosis not present

## 2021-06-06 DIAGNOSIS — K5730 Diverticulosis of large intestine without perforation or abscess without bleeding: Secondary | ICD-10-CM | POA: Diagnosis not present

## 2021-06-06 DIAGNOSIS — Z885 Allergy status to narcotic agent status: Secondary | ICD-10-CM | POA: Insufficient documentation

## 2021-06-06 DIAGNOSIS — D120 Benign neoplasm of cecum: Secondary | ICD-10-CM | POA: Diagnosis not present

## 2021-06-06 DIAGNOSIS — D125 Benign neoplasm of sigmoid colon: Secondary | ICD-10-CM | POA: Diagnosis not present

## 2021-06-06 DIAGNOSIS — D123 Benign neoplasm of transverse colon: Secondary | ICD-10-CM | POA: Diagnosis not present

## 2021-06-06 DIAGNOSIS — Z1211 Encounter for screening for malignant neoplasm of colon: Secondary | ICD-10-CM

## 2021-06-06 DIAGNOSIS — K648 Other hemorrhoids: Secondary | ICD-10-CM | POA: Diagnosis not present

## 2021-06-06 DIAGNOSIS — K644 Residual hemorrhoidal skin tags: Secondary | ICD-10-CM | POA: Insufficient documentation

## 2021-06-06 HISTORY — PX: COLONOSCOPY WITH PROPOFOL: SHX5780

## 2021-06-06 HISTORY — PX: POLYPECTOMY: SHX5525

## 2021-06-06 SURGERY — COLONOSCOPY WITH PROPOFOL
Anesthesia: Monitor Anesthesia Care

## 2021-06-06 MED ORDER — PROPOFOL 500 MG/50ML IV EMUL
INTRAVENOUS | Status: AC
  Filled 2021-06-06: qty 50

## 2021-06-06 MED ORDER — PROPOFOL 500 MG/50ML IV EMUL
INTRAVENOUS | Status: DC | PRN
  Administered 2021-06-06: 125 ug/kg/min via INTRAVENOUS

## 2021-06-06 MED ORDER — ONDANSETRON HCL 4 MG/2ML IJ SOLN: 4.0000 mg | Freq: Once | INTRAMUSCULAR | Status: DC | PRN

## 2021-06-06 MED ORDER — PROPOFOL 10 MG/ML IV BOLUS
INTRAVENOUS | Status: AC
  Filled 2021-06-06: qty 20

## 2021-06-06 MED ORDER — LACTATED RINGERS IV SOLN: INTRAVENOUS | Status: DC

## 2021-06-06 MED ORDER — SODIUM CHLORIDE 0.9 % IV SOLN: INTRAVENOUS | Status: DC

## 2021-06-06 MED ORDER — LIDOCAINE 2% (20 MG/ML) 5 ML SYRINGE
INTRAMUSCULAR | Status: DC | PRN
  Administered 2021-06-06: 60 mg via INTRAVENOUS
  Administered 2021-06-06: 40 mg via INTRAVENOUS

## 2021-06-06 MED ORDER — PROPOFOL 10 MG/ML IV BOLUS
INTRAVENOUS | Status: DC | PRN
  Administered 2021-06-06 (×3): 20 mg via INTRAVENOUS
  Administered 2021-06-06 (×2): 30 mg via INTRAVENOUS

## 2021-06-06 SURGICAL SUPPLY — 21 items

## 2021-06-06 NOTE — H&P (Signed)
   Referring Provider: No ref. provider found Primary Care Physician:  Manon Hilding, MD  Reason for Consultation:  Colon polyps   IMPRESSION:  Colon polyps not removed on colonoscopy 03/2021 Son with colon polyps  PLAN: Colonoscopy with MAC  HPI: Carolyn Holmes is a 70 y.o. female referred for colon polyps. Screening colonoscopy with Dr. Adelina Mings 03/28/21 was incomplete due to patient instability from coughing during the procedure. Colonoscopy report mentions a tortuous colon and at least 4 polyps.   No prior attempts at colonoscopy. Son with colon polyps. No other known family history of colon cancer or polyps. No family history of uterine/endometrial cancer, pancreatic cancer or gastric/stomach cancer.  GI ROS is negative.    Past Medical History:  Diagnosis Date   GERD (gastroesophageal reflux disease)    Hypertension     Past Surgical History:  Procedure Laterality Date   HERNIA REPAIR     TRANSFORAMINAL LUMBAR INTERBODY FUSION (TLIF) WITH PEDICLE SCREW FIXATION 1 LEVEL Left 07/08/2019   Procedure: Left Lumbar Five Sacral One minimally invasive transforaminal lumbar interbody fusion with bilateral Lumbar Five Sacral One pedicle screws;  Surgeon: Judith Part, MD;  Location: [facility];  Service: Neurosurgery;  Laterality: Left;  Left Lumbar Five Sacral One minimally invasive transforaminal lumbar interbody fusion with bilateral Lumbar Five Sacral One pedicle screws   WISDOM TOOTH EXTRACTION     x4    Current Facility-Administered Medications  Medication Dose Route Frequency Provider Last Rate Last Admin   0.9 %  sodium chloride infusion   Intravenous Continuous Thornton Park, MD       lactated ringers infusion   Intravenous Continuous Thornton Park, MD 10 mL/hr at 06/06/21 0742 New Bag at 06/06/21 0742    Allergies as of 04/12/2021 - Review Complete 07/08/2019  Allergen Reaction Noted   Codeine Itching 12/27/2018    Family History  Problem Relation  Age of Onset   Colon cancer Neg Hx    Esophageal cancer Neg Hx    Rectal cancer Neg Hx    Stomach cancer Neg Hx    Colon polyps Neg Hx       Physical Exam: General:   Alert,  well-nourished, pleasant and cooperative in NAD Head:  Normocephalic and atraumatic. Eyes:  Sclera clear, no icterus.   Conjunctiva pink. Ears:  Normal auditory acuity. Nose:  No deformity, discharge,  or lesions. Mouth:  No deformity or lesions.   Neck:  Supple; no masses or thyromegaly. Lungs:  Clear throughout to auscultation.   No wheezes. Heart:  Regular rate and rhythm; no murmurs. Abdomen:  Soft, nontender, nondistended, normal bowel sounds, no rebound or guarding. No hepatosplenomegaly.   Rectal:  Deferred  Msk:  Symmetrical. No boney deformities LAD: No inguinal or umbilical LAD Extremities:  No clubbing or edema. Neurologic:  Alert and  oriented x4;  grossly nonfocal Skin:  Intact without significant lesions or rashes. Psych:  Alert and cooperative. Normal mood and affect.     Hydia Copelin L. Tarri Glenn, MD, MPH 06/06/2021, 7:55 AM

## 2021-06-06 NOTE — Anesthesia Postprocedure Evaluation (Signed)
Anesthesia Post Note  Patient: Carolyn Holmes  Procedure(s) Performed: COLONOSCOPY WITH PROPOFOL POLYPECTOMY     Patient location during evaluation: Endoscopy Anesthesia Type: MAC Level of consciousness: awake and alert Pain management: pain level controlled Vital Signs Assessment: post-procedure vital signs reviewed and stable Respiratory status: spontaneous breathing, nonlabored ventilation, respiratory function stable and patient connected to nasal cannula oxygen Cardiovascular status: stable and blood pressure returned to baseline Postop Assessment: no apparent nausea or vomiting Anesthetic complications: no   No notable events documented.  Last Vitals:  Vitals:   06/06/21 0946 06/06/21 1000  BP: (!) 148/58 (!) 187/99  Pulse: 81 87  Resp: (!) 25 19  Temp: 36.7 C   SpO2: 100% 98%    Last Pain:  Vitals:   06/06/21 1000  TempSrc:   PainSc: 0-No pain                 Catalina Gravel

## 2021-06-06 NOTE — Transfer of Care (Signed)
Immediate Anesthesia Transfer of Care Note  Patient: Sheela Stack  Procedure(s) Performed: COLONOSCOPY WITH PROPOFOL POLYPECTOMY  Patient Location: PACU and Endoscopy Unit  Anesthesia Type:MAC  Level of Consciousness: awake and patient cooperative  Airway & Oxygen Therapy: Patient Spontanous Breathing and Patient connected to face mask oxygen  Post-op Assessment: Report given to RN and Post -op Vital signs reviewed and stable  Post vital signs: Reviewed and stable  Last Vitals:  Vitals Value Taken Time  BP    Temp    Pulse    Resp    SpO2      Last Pain:  Vitals:   06/06/21 0721  PainSc: 0-No pain         Complications: No notable events documented.

## 2021-06-06 NOTE — Anesthesia Preprocedure Evaluation (Addendum)
Anesthesia Evaluation  Patient identified by MRN, date of birth, ID band Patient awake    Reviewed: Allergy & Precautions, NPO status , Patient's Chart, lab work & pertinent test results  Airway Mallampati: II  TM Distance: >3 FB Neck ROM: Full    Dental  (+) Dental Advisory Given, Edentulous Lower, Edentulous Upper   Pulmonary Current Smoker,    Pulmonary exam normal breath sounds clear to auscultation       Cardiovascular hypertension, Pt. on medications Normal cardiovascular exam Rhythm:Regular Rate:Normal     Neuro/Psych negative neurological ROS     GI/Hepatic Neg liver ROS, GERD  ,  Endo/Other  negative endocrine ROS  Renal/GU negative Renal ROS     Musculoskeletal negative musculoskeletal ROS (+)   Abdominal   Peds  Hematology negative hematology ROS (+)   Anesthesia Other Findings Day of surgery medications reviewed with the patient.  Reproductive/Obstetrics                            Anesthesia Physical Anesthesia Plan  ASA: 2  Anesthesia Plan: MAC   Post-op Pain Management:    Induction: Intravenous  PONV Risk Score and Plan: 1 and Propofol infusion and Treatment may vary due to age or medical condition  Airway Management Planned: Nasal Cannula and Natural Airway  Additional Equipment:   Intra-op Plan:   Post-operative Plan:   Informed Consent: I have reviewed the patients History and Physical, chart, labs and discussed the procedure including the risks, benefits and alternatives for the proposed anesthesia with the patient or authorized representative who has indicated his/her understanding and acceptance.     Dental advisory given  Plan Discussed with: CRNA and Anesthesiologist  Anesthesia Plan Comments: (Discussed risks/benefits/alternatives to MAC sedation including need for ventilatory support, hypotension, need for conversion to general anesthesia.  All  patient questions answered.  Patient/guardian wishes to proceed.)        Anesthesia Quick Evaluation

## 2021-06-06 NOTE — Op Note (Addendum)
[facility] Patient Name: Carolyn Holmes Procedure Date: 06/06/2021 MRN: [id number] Attending MD: Thornton Park MD, MD Date of Birth: June 07, 1951 CSN: [id number] Age: 70 Admit Type: Outpatient Procedure:                Colonoscopy Indications:              Screening for colorectal malignant neoplasm                           Screening colonoscopy with Dr. Genia Plants                            03/28/21 incomplete due to patient instability from                            coughing during the procedure. Polyps identified                            but not removed.                           Son with colon polyps.                           No known family history of colon cancer. Providers:                Thornton Park MD, MD, Terrall Laity, Doristine Johns, RN, Benetta Spar, Technician Referring MD:              Medicines:                Monitored Anesthesia Care Complications:            No immediate complications. Estimated blood loss:                            Minimal. Estimated Blood Loss:     Estimated blood loss was minimal. Procedure:                Pre-Anesthesia Assessment:                           - Prior to the procedure, a History and Physical                            was performed, and patient medications and                            allergies were reviewed. The patient's tolerance of                            previous anesthesia was also reviewed. The risks                            and benefits of the procedure and the sedation  options and risks were discussed with the patient.                            All questions were answered, and informed consent                            was obtained. Prior Anticoagulants: The patient has                            taken no previous anticoagulant or antiplatelet                            agents. ASA Grade Assessment: II - A patient with                             mild systemic disease. After reviewing the risks                            and benefits, the patient was deemed in                            satisfactory condition to undergo the procedure.                           After obtaining informed consent, the colonoscope                            was passed under direct vision. Throughout the                            procedure, the patient's blood pressure, pulse, and                            oxygen saturations were monitored continuously. The                            PCF-H190DL ([id number]) Olympus pediatric colonscope                            was introduced through the anus and advanced to the                            the cecum, identified by appendiceal orifice and                            ileocecal valve. The colonoscopy was performed with                            moderate difficulty due to a redundant colon,                            significant looping and a tortuous colon.  Successful completion of the procedure was aided by                            withdrawing and reinserting the scope. The patient                            tolerated the procedure well. The quality of the                            bowel preparation was good. The ileocecal valve,                            appendiceal orifice, and rectum were photographed. Scope In: 8:55:36 AM Scope Out: 9:42:55 AM Scope Withdrawal Time: 0 hours 42 minutes 31 seconds  Total Procedure Duration: 0 hours 47 minutes 19 seconds  Findings:      Non-bleeding external and internal hemorrhoids were found.      Multiple small and large-mouthed diverticula were found in the sigmoid       colon and distal descending colon.      Three sessile polyps were found in the cecum. The polyps were 2 to 4 mm       in size. These polyps were removed with a cold snare. Resection and       retrieval were complete. Estimated blood loss was  minimal.      A 14 mm polyp was found in the cecum. The polyp was multi-lobulated and       serpiginous. The polyp was removed with a piecemeal technique using a       cold snare. Resection and retrieval were complete. Estimated blood loss       was minimal.      A 3 mm polyp was found in the mid ascending colon. The polyp was       sessile. The polyp was removed with a cold snare. Resection and       retrieval were complete. Estimated blood loss was minimal.      Six sessile polyps were found in the transverse colon and hepatic       flexure. The polyps were 2 to 5 mm in size. These polyps were removed       with a cold snare. Resection and retrieval were complete. Estimated       blood loss was minimal.      Three semi-pedunculated polyps were found in the distal sigmoid colon.       The polyps were 10 to 15 mm in size. These polyps were removed with a       cold snare. Resection and retrieval were complete. Estimated blood loss       was minimal.      The exam was otherwise without abnormality on direct and retroflexion       views. Impression:               - Non-bleeding external and internal hemorrhoids.                           - Diverticulosis in the sigmoid colon and in the  distal descending colon.                           - Three 2 to 4 mm polyps in the cecum, removed with                            a cold snare. Resected and retrieved.                           - One 10 mm polyp in the cecum, removed piecemeal                            using a cold snare. Resected and retrieved.                           - One 3 mm polyp in the mid ascending colon,                            removed with a cold snare. Resected and retrieved.                           - Six 2 to 5 mm polyps in the transverse colon and                            at the hepatic flexure, removed with a cold snare.                            Resected and retrieved.                            - Three 10 to 15 mm polyps in the distal sigmoid                            colon, removed with a cold snare. Resected and                            retrieved.                           - The examination was otherwise normal on direct                            and retroflexion views. Moderate Sedation:      Not Applicable - Patient had care per Anesthesia. Recommendation:           - Patient has a contact number available for                            emergencies. The signs and symptoms of potential                            delayed complications were discussed with the  patient. Return to normal activities tomorrow.                            Written discharge instructions were provided to the                            patient.                           - High fiber diet.                           - Continue present medications.                           - Await pathology results.                           - Repeat colonoscopy in 1 year for surveillance.                           - Referral for genetic testing.                           - Emerging evidence supports eating a diet of                            fruits, vegetables, grains, calcium, and yogurt                            while reducing red meat and alcohol may reduce the                            risk of colon cancer.                           - Given these results, all first degree relatives                            (brothers, sisters, children, parents) should start                            colon cancer screening at age 45.                           - Thank you for allowing me to be involved in your                            colon cancer prevention. Procedure Code(s):        --- Professional ---                           [phone], Colonoscopy, flexible; with removal of                            tumor(s), polyp(s), or other lesion(s)  by snare                             technique Diagnosis Code(s):        --- Professional ---                           K63.5, Polyp of colon                           Z12.11, Encounter for screening for malignant                            neoplasm of colon                           K64.8, Other hemorrhoids                           K57.30, Diverticulosis of large intestine without                            perforation or abscess without bleeding CPT copyright 2019 [employer]. All rights reserved. The codes documented in this report are preliminary and upon coder review may  be revised to meet current compliance requirements. Thornton Park MD, MD 06/06/2021 9:55:50 AM This report has been signed electronically. Number of Addenda: 0

## 2021-06-06 NOTE — Discharge Instructions (Signed)

## 2021-06-06 NOTE — Anesthesia Procedure Notes (Signed)
Procedure Name: MAC Date/Time: 06/06/2021 8:45 AM Performed by: Lollie Sails, CRNA Pre-anesthesia Checklist: Patient identified, Emergency Drugs available, Suction available, Patient being monitored and Timeout performed Oxygen Delivery Method: Simple face mask

## 2021-06-07 ENCOUNTER — Encounter (HOSPITAL_COMMUNITY): Payer: Self-pay | Admitting: Gastroenterology

## 2021-06-07 ENCOUNTER — Encounter: Payer: Self-pay | Admitting: Gastroenterology

## 2021-06-07 DIAGNOSIS — Z01818 Encounter for other preprocedural examination: Secondary | ICD-10-CM | POA: Diagnosis not present

## 2021-06-07 DIAGNOSIS — N8110 Cystocele, unspecified: Secondary | ICD-10-CM | POA: Diagnosis not present

## 2021-06-07 LAB — SURGICAL PATHOLOGY

## 2021-06-08 DIAGNOSIS — N800 Endometriosis of uterus: Secondary | ICD-10-CM | POA: Diagnosis not present

## 2021-06-08 DIAGNOSIS — Z01818 Encounter for other preprocedural examination: Secondary | ICD-10-CM | POA: Diagnosis not present

## 2021-06-08 DIAGNOSIS — N736 Female pelvic peritoneal adhesions (postinfective): Secondary | ICD-10-CM | POA: Diagnosis not present

## 2021-06-08 DIAGNOSIS — Z87891 Personal history of nicotine dependence: Secondary | ICD-10-CM | POA: Diagnosis not present

## 2021-06-08 DIAGNOSIS — E1165 Type 2 diabetes mellitus with hyperglycemia: Secondary | ICD-10-CM | POA: Diagnosis not present

## 2021-06-08 DIAGNOSIS — D259 Leiomyoma of uterus, unspecified: Secondary | ICD-10-CM | POA: Diagnosis not present

## 2021-06-08 DIAGNOSIS — Z20822 Contact with and (suspected) exposure to covid-19: Secondary | ICD-10-CM | POA: Diagnosis present

## 2021-06-08 DIAGNOSIS — Z01812 Encounter for preprocedural laboratory examination: Secondary | ICD-10-CM | POA: Diagnosis not present

## 2021-06-08 DIAGNOSIS — N813 Complete uterovaginal prolapse: Secondary | ICD-10-CM | POA: Diagnosis present

## 2021-06-08 DIAGNOSIS — N858 Other specified noninflammatory disorders of uterus: Secondary | ICD-10-CM | POA: Diagnosis not present

## 2021-06-08 DIAGNOSIS — N8110 Cystocele, unspecified: Secondary | ICD-10-CM | POA: Diagnosis not present

## 2021-07-07 DIAGNOSIS — F17210 Nicotine dependence, cigarettes, uncomplicated: Secondary | ICD-10-CM | POA: Diagnosis not present

## 2021-07-07 DIAGNOSIS — Z6827 Body mass index (BMI) 27.0-27.9, adult: Secondary | ICD-10-CM | POA: Diagnosis not present

## 2021-07-07 DIAGNOSIS — K121 Other forms of stomatitis: Secondary | ICD-10-CM | POA: Diagnosis not present

## 2021-08-01 DIAGNOSIS — R300 Dysuria: Secondary | ICD-10-CM | POA: Diagnosis not present

## 2021-08-01 DIAGNOSIS — S060X9A Concussion with loss of consciousness of unspecified duration, initial encounter: Secondary | ICD-10-CM | POA: Diagnosis not present

## 2021-08-01 DIAGNOSIS — R0781 Pleurodynia: Secondary | ICD-10-CM | POA: Diagnosis not present

## 2021-08-01 DIAGNOSIS — Z6827 Body mass index (BMI) 27.0-27.9, adult: Secondary | ICD-10-CM | POA: Diagnosis not present

## 2021-08-05 DIAGNOSIS — H25813 Combined forms of age-related cataract, bilateral: Secondary | ICD-10-CM | POA: Diagnosis not present

## 2021-08-05 DIAGNOSIS — H35371 Puckering of macula, right eye: Secondary | ICD-10-CM | POA: Diagnosis not present

## 2021-08-05 DIAGNOSIS — H40013 Open angle with borderline findings, low risk, bilateral: Secondary | ICD-10-CM | POA: Diagnosis not present

## 2021-08-23 DIAGNOSIS — E782 Mixed hyperlipidemia: Secondary | ICD-10-CM | POA: Diagnosis not present

## 2021-08-23 DIAGNOSIS — J441 Chronic obstructive pulmonary disease with (acute) exacerbation: Secondary | ICD-10-CM | POA: Diagnosis not present

## 2021-08-23 DIAGNOSIS — E7849 Other hyperlipidemia: Secondary | ICD-10-CM | POA: Diagnosis not present

## 2021-08-23 DIAGNOSIS — E7800 Pure hypercholesterolemia, unspecified: Secondary | ICD-10-CM | POA: Diagnosis not present

## 2021-08-23 DIAGNOSIS — R739 Hyperglycemia, unspecified: Secondary | ICD-10-CM | POA: Diagnosis not present

## 2021-08-23 DIAGNOSIS — E7801 Familial hypercholesterolemia: Secondary | ICD-10-CM | POA: Diagnosis not present

## 2021-08-23 DIAGNOSIS — I10 Essential (primary) hypertension: Secondary | ICD-10-CM | POA: Diagnosis not present

## 2021-08-26 DIAGNOSIS — Z6827 Body mass index (BMI) 27.0-27.9, adult: Secondary | ICD-10-CM | POA: Diagnosis not present

## 2021-08-26 DIAGNOSIS — I10 Essential (primary) hypertension: Secondary | ICD-10-CM | POA: Diagnosis not present

## 2021-08-26 DIAGNOSIS — S060XAA Concussion with loss of consciousness status unknown, initial encounter: Secondary | ICD-10-CM | POA: Diagnosis not present

## 2021-08-26 DIAGNOSIS — Z23 Encounter for immunization: Secondary | ICD-10-CM | POA: Diagnosis not present

## 2021-08-26 DIAGNOSIS — R7301 Impaired fasting glucose: Secondary | ICD-10-CM | POA: Diagnosis not present

## 2021-08-26 DIAGNOSIS — F325 Major depressive disorder, single episode, in full remission: Secondary | ICD-10-CM | POA: Diagnosis not present

## 2021-08-26 DIAGNOSIS — Z720 Tobacco use: Secondary | ICD-10-CM | POA: Diagnosis not present

## 2021-08-26 DIAGNOSIS — E7849 Other hyperlipidemia: Secondary | ICD-10-CM | POA: Diagnosis not present

## 2021-09-15 DIAGNOSIS — M810 Age-related osteoporosis without current pathological fracture: Secondary | ICD-10-CM | POA: Diagnosis not present

## 2021-09-28 DIAGNOSIS — H2512 Age-related nuclear cataract, left eye: Secondary | ICD-10-CM | POA: Diagnosis not present

## 2021-09-28 DIAGNOSIS — H35363 Drusen (degenerative) of macula, bilateral: Secondary | ICD-10-CM | POA: Diagnosis not present

## 2021-09-28 DIAGNOSIS — H2511 Age-related nuclear cataract, right eye: Secondary | ICD-10-CM | POA: Diagnosis not present

## 2021-09-28 DIAGNOSIS — H40013 Open angle with borderline findings, low risk, bilateral: Secondary | ICD-10-CM | POA: Diagnosis not present

## 2021-10-10 DIAGNOSIS — H25011 Cortical age-related cataract, right eye: Secondary | ICD-10-CM | POA: Diagnosis not present

## 2021-10-10 DIAGNOSIS — H2511 Age-related nuclear cataract, right eye: Secondary | ICD-10-CM | POA: Diagnosis not present

## 2021-10-11 DIAGNOSIS — Z961 Presence of intraocular lens: Secondary | ICD-10-CM | POA: Diagnosis not present

## 2021-10-11 DIAGNOSIS — H2512 Age-related nuclear cataract, left eye: Secondary | ICD-10-CM | POA: Diagnosis not present

## 2021-10-17 DIAGNOSIS — M7661 Achilles tendinitis, right leg: Secondary | ICD-10-CM | POA: Diagnosis not present

## 2021-10-17 DIAGNOSIS — M79671 Pain in right foot: Secondary | ICD-10-CM | POA: Diagnosis not present

## 2021-10-27 DIAGNOSIS — M7661 Achilles tendinitis, right leg: Secondary | ICD-10-CM | POA: Diagnosis not present

## 2021-10-31 DIAGNOSIS — H25012 Cortical age-related cataract, left eye: Secondary | ICD-10-CM | POA: Diagnosis not present

## 2021-10-31 DIAGNOSIS — H2512 Age-related nuclear cataract, left eye: Secondary | ICD-10-CM | POA: Diagnosis not present

## 2021-11-01 DIAGNOSIS — M7661 Achilles tendinitis, right leg: Secondary | ICD-10-CM | POA: Diagnosis not present

## 2021-11-01 DIAGNOSIS — M79671 Pain in right foot: Secondary | ICD-10-CM | POA: Diagnosis not present

## 2021-11-01 DIAGNOSIS — M7731 Calcaneal spur, right foot: Secondary | ICD-10-CM | POA: Diagnosis not present

## 2021-12-01 DIAGNOSIS — M7661 Achilles tendinitis, right leg: Secondary | ICD-10-CM | POA: Diagnosis not present

## 2021-12-14 DIAGNOSIS — M7661 Achilles tendinitis, right leg: Secondary | ICD-10-CM | POA: Diagnosis not present

## 2022-02-06 DIAGNOSIS — M7661 Achilles tendinitis, right leg: Secondary | ICD-10-CM | POA: Diagnosis not present

## 2022-02-21 DIAGNOSIS — E7800 Pure hypercholesterolemia, unspecified: Secondary | ICD-10-CM | POA: Diagnosis not present

## 2022-02-21 DIAGNOSIS — E7849 Other hyperlipidemia: Secondary | ICD-10-CM | POA: Diagnosis not present

## 2022-02-21 DIAGNOSIS — E7801 Familial hypercholesterolemia: Secondary | ICD-10-CM | POA: Diagnosis not present

## 2022-02-21 DIAGNOSIS — E782 Mixed hyperlipidemia: Secondary | ICD-10-CM | POA: Diagnosis not present

## 2022-02-21 DIAGNOSIS — I10 Essential (primary) hypertension: Secondary | ICD-10-CM | POA: Diagnosis not present

## 2022-02-21 DIAGNOSIS — R7309 Other abnormal glucose: Secondary | ICD-10-CM | POA: Diagnosis not present

## 2022-02-21 DIAGNOSIS — Z1231 Encounter for screening mammogram for malignant neoplasm of breast: Secondary | ICD-10-CM | POA: Diagnosis not present

## 2022-03-09 DIAGNOSIS — Z6828 Body mass index (BMI) 28.0-28.9, adult: Secondary | ICD-10-CM | POA: Diagnosis not present

## 2022-03-09 DIAGNOSIS — E7849 Other hyperlipidemia: Secondary | ICD-10-CM | POA: Diagnosis not present

## 2022-03-09 DIAGNOSIS — S060XAA Concussion with loss of consciousness status unknown, initial encounter: Secondary | ICD-10-CM | POA: Diagnosis not present

## 2022-03-09 DIAGNOSIS — I10 Essential (primary) hypertension: Secondary | ICD-10-CM | POA: Diagnosis not present

## 2022-03-09 DIAGNOSIS — Z720 Tobacco use: Secondary | ICD-10-CM | POA: Diagnosis not present

## 2022-03-09 DIAGNOSIS — F325 Major depressive disorder, single episode, in full remission: Secondary | ICD-10-CM | POA: Diagnosis not present

## 2022-03-09 DIAGNOSIS — R7301 Impaired fasting glucose: Secondary | ICD-10-CM | POA: Diagnosis not present

## 2022-04-24 DIAGNOSIS — H35363 Drusen (degenerative) of macula, bilateral: Secondary | ICD-10-CM | POA: Diagnosis not present

## 2022-04-24 DIAGNOSIS — Z961 Presence of intraocular lens: Secondary | ICD-10-CM | POA: Diagnosis not present

## 2022-04-24 DIAGNOSIS — H40013 Open angle with borderline findings, low risk, bilateral: Secondary | ICD-10-CM | POA: Diagnosis not present

## 2022-08-22 DIAGNOSIS — F17210 Nicotine dependence, cigarettes, uncomplicated: Secondary | ICD-10-CM | POA: Diagnosis not present

## 2022-08-22 DIAGNOSIS — Z6830 Body mass index (BMI) 30.0-30.9, adult: Secondary | ICD-10-CM | POA: Diagnosis not present

## 2022-08-22 DIAGNOSIS — Z23 Encounter for immunization: Secondary | ICD-10-CM | POA: Diagnosis not present

## 2022-08-22 DIAGNOSIS — M5430 Sciatica, unspecified side: Secondary | ICD-10-CM | POA: Diagnosis not present

## 2022-09-04 DIAGNOSIS — I10 Essential (primary) hypertension: Secondary | ICD-10-CM | POA: Diagnosis not present

## 2022-09-04 DIAGNOSIS — R739 Hyperglycemia, unspecified: Secondary | ICD-10-CM | POA: Diagnosis not present

## 2022-09-04 DIAGNOSIS — R7301 Impaired fasting glucose: Secondary | ICD-10-CM | POA: Diagnosis not present

## 2022-09-04 DIAGNOSIS — E7849 Other hyperlipidemia: Secondary | ICD-10-CM | POA: Diagnosis not present

## 2022-09-08 DIAGNOSIS — I10 Essential (primary) hypertension: Secondary | ICD-10-CM | POA: Diagnosis not present

## 2022-09-08 DIAGNOSIS — Z720 Tobacco use: Secondary | ICD-10-CM | POA: Diagnosis not present

## 2022-09-08 DIAGNOSIS — R7301 Impaired fasting glucose: Secondary | ICD-10-CM | POA: Diagnosis not present

## 2022-09-08 DIAGNOSIS — Z1389 Encounter for screening for other disorder: Secondary | ICD-10-CM | POA: Diagnosis not present

## 2022-09-08 DIAGNOSIS — Z6831 Body mass index (BMI) 31.0-31.9, adult: Secondary | ICD-10-CM | POA: Diagnosis not present

## 2022-09-08 DIAGNOSIS — F325 Major depressive disorder, single episode, in full remission: Secondary | ICD-10-CM | POA: Diagnosis not present

## 2022-09-08 DIAGNOSIS — Z23 Encounter for immunization: Secondary | ICD-10-CM | POA: Diagnosis not present

## 2022-09-08 DIAGNOSIS — Z1331 Encounter for screening for depression: Secondary | ICD-10-CM | POA: Diagnosis not present

## 2022-09-08 DIAGNOSIS — S060XAA Concussion with loss of consciousness status unknown, initial encounter: Secondary | ICD-10-CM | POA: Diagnosis not present

## 2022-09-08 DIAGNOSIS — F17210 Nicotine dependence, cigarettes, uncomplicated: Secondary | ICD-10-CM | POA: Diagnosis not present

## 2022-09-08 DIAGNOSIS — E7849 Other hyperlipidemia: Secondary | ICD-10-CM | POA: Diagnosis not present

## 2022-09-08 DIAGNOSIS — E6609 Other obesity due to excess calories: Secondary | ICD-10-CM | POA: Diagnosis not present

## 2022-09-26 ENCOUNTER — Telehealth: Payer: Self-pay | Admitting: Gastroenterology

## 2022-09-26 NOTE — Telephone Encounter (Signed)
Dr. Beavers,  This pt is cleared for anesthetic care at [facility].  Thanks,  Dina Warbington 

## 2022-09-26 NOTE — Telephone Encounter (Signed)
Inbound call from patient stating that she has a referral in for a colonoscopy. Patient had a colonoscopy with Dr. Tarri Glenn in July of 2022 at [facility] and report stated to have another colonoscopy in a year. Patient is seeking advice if she can be schedule at our practice or if she needs to have procedure at [facility] again. Please advise.

## 2022-10-18 ENCOUNTER — Encounter: Payer: Self-pay | Admitting: Gastroenterology

## 2022-10-18 NOTE — Telephone Encounter (Signed)
LVM for patient to schedule colonoscopy in [place].

## 2022-10-23 ENCOUNTER — Ambulatory Visit (AMBULATORY_SURGERY_CENTER): Payer: Medicare Other | Admitting: *Deleted

## 2022-10-23 VITALS — Ht 61.0 in | Wt 160.0 lb

## 2022-10-23 DIAGNOSIS — Z86010 Personal history of colonic polyps: Secondary | ICD-10-CM

## 2022-10-23 NOTE — Progress Notes (Signed)
No egg or soy allergy known to patient  No issues known to pt with past sedation with any surgeries or procedures Patient denies ever being told they had issues or difficulty with intubation  No FH of Malignant Hyperthermia Pt is not on diet pills Pt is not on  home 02  Pt is not on blood thinners  Pt denies issues with constipation  No A fib or A flutter Have any cardiac testing pending--NO Pt instructed to use Singlecare.com or GoodRx for a price reduction on prep    Sample sheet of over the counter items to purchase for prep mailed with packet.

## 2022-11-02 ENCOUNTER — Ambulatory Visit (AMBULATORY_SURGERY_CENTER): Payer: Medicare Other | Admitting: Gastroenterology

## 2022-11-02 ENCOUNTER — Other Ambulatory Visit: Payer: Self-pay

## 2022-11-02 ENCOUNTER — Encounter: Payer: Self-pay | Admitting: Gastroenterology

## 2022-11-02 ENCOUNTER — Telehealth: Payer: Self-pay

## 2022-11-02 VITALS — BP 157/97 | HR 93 | Temp 97.1°F | Resp 18 | Ht 61.0 in | Wt 160.0 lb

## 2022-11-02 DIAGNOSIS — Z86010 Personal history of colonic polyps: Secondary | ICD-10-CM | POA: Diagnosis not present

## 2022-11-02 DIAGNOSIS — D122 Benign neoplasm of ascending colon: Secondary | ICD-10-CM

## 2022-11-02 DIAGNOSIS — D124 Benign neoplasm of descending colon: Secondary | ICD-10-CM | POA: Diagnosis not present

## 2022-11-02 DIAGNOSIS — Z09 Encounter for follow-up examination after completed treatment for conditions other than malignant neoplasm: Secondary | ICD-10-CM | POA: Diagnosis not present

## 2022-11-02 DIAGNOSIS — D120 Benign neoplasm of cecum: Secondary | ICD-10-CM | POA: Diagnosis not present

## 2022-11-02 DIAGNOSIS — D123 Benign neoplasm of transverse colon: Secondary | ICD-10-CM

## 2022-11-02 DIAGNOSIS — I10 Essential (primary) hypertension: Secondary | ICD-10-CM | POA: Diagnosis not present

## 2022-11-02 DIAGNOSIS — K621 Rectal polyp: Secondary | ICD-10-CM | POA: Diagnosis not present

## 2022-11-02 DIAGNOSIS — D128 Benign neoplasm of rectum: Secondary | ICD-10-CM | POA: Diagnosis not present

## 2022-11-02 DIAGNOSIS — K635 Polyp of colon: Secondary | ICD-10-CM | POA: Diagnosis not present

## 2022-11-02 MED ORDER — SODIUM CHLORIDE 0.9 % IV SOLN
500.0000 mL | Freq: Once | INTRAVENOUS | Status: DC
Start: 2022-11-02 — End: 2022-11-02

## 2022-11-02 NOTE — Telephone Encounter (Signed)
Referral placed.

## 2022-11-02 NOTE — Op Note (Signed)
[place] Patient Name: Carolyn Holmes Procedure Date: 11/02/2022 9:14 AM MRN: [id number] Endoscopist: Thornton Park MD, MD, [id number] Age: 71 Referring MD:  Date of Birth: 11/24/1950 Gender: Female Account #: [id number] Procedure:                Colonoscopy Indications:              Surveillance: History of numerous (> 10) adenomas                            on last colonoscopy (< 3 yrs)                           Screening colonoscopy with Dr. Adelina Mings                            03/28/21 was incomplete due to patient instability                            from coughing during the procedure. Colonoscopy                            report mentions a tortuous colon and at least 4                            polyps.                           14 tubular adenomas on colonoscoyp 2022 with 3 over                            15m in size Medicines:                Monitored Anesthesia Care Procedure:                Pre-Anesthesia Assessment:                           - Prior to the procedure, a History and Physical                            was performed, and patient medications and                            allergies were reviewed. The patient's tolerance of                            previous anesthesia was also reviewed. The risks                            and benefits of the procedure and the sedation                            options and risks were discussed with the patient.  All questions were answered, and informed consent                            was obtained. Prior Anticoagulants: The patient has                            taken no anticoagulant or antiplatelet agents. ASA                            Grade Assessment: II - A patient with mild systemic                            disease. After reviewing the risks and benefits,                            the patient was deemed in satisfactory condition to                             undergo the procedure.                           After obtaining informed consent, the colonoscope                            was passed under direct vision. Throughout the                            procedure, the patient's blood pressure, pulse, and                            oxygen saturations were monitored continuously. The                            Olympus CF-HQ190L ([id number]) Colonoscope was                            introduced through the anus and advanced to the 3                            cm into the ileum. A second forward view of the                            right colon was performed. The colonoscopy was                            performed with moderate difficulty due to a                            redundant colon, significant looping and a tortuous                            colon. Successful completion of the procedure was  aided by applying abdominal pressure. The patient                            tolerated the procedure well. The quality of the                            bowel preparation was good. The terminal ileum,                            ileocecal valve, appendiceal orifice, and rectum                            were photographed. Scope In: 9:18:11 AM Scope Out: 9:42:10 AM Scope Withdrawal Time: 0 hours 20 minutes 23 seconds  Total Procedure Duration: 0 hours 23 minutes 59 seconds  Findings:                 The perianal and digital rectal examinations were                            normal.                           Non-bleeding internal hemorrhoids were found.                           Multiple medium-mouthed and small-mouthed                            diverticula were found in the sigmoid colon and                            descending colon.                           Three sessile polyps were found in the rectum and                            descending colon. The polyps were 2 to 4 mm in                            size. These  polyps were removed with a cold snare.                            Resection and retrieval were complete. Estimated                            blood loss was minimal.                           A 3 mm polyp was found in the transverse colon. The                            polyp was sessile. The polyp was removed with a  cold snare. Resection and retrieval were complete.                            Estimated blood loss was minimal.                           Three sessile polyps were found in the hepatic                            flexure. The polyps were 2 to 5 mm in size. These                            polyps were removed with a cold snare. Resection                            and retrieval were complete. Estimated blood loss                            was minimal.                           Two sessile polyps were found in the ascending                            colon and cecum. The polyps were 2 to 3 mm in size.                            These polyps were removed with a cold snare.                            Resection and retrieval were complete. Estimated                            blood loss was minimal.                           The exam was otherwise without abnormality on                            direct and retroflexion views. Complications:            No immediate complications. Estimated Blood Loss:     Estimated blood loss was minimal. Impression:               - Non-bleeding internal hemorrhoids.                           - Diverticulosis in the sigmoid colon and in the                            descending colon.                           - Three 2 to 4 mm polyps in the rectum and in the  descending colon, removed with a cold snare.                            Resected and retrieved.                           - One 3 mm polyp in the transverse colon, removed                            with a cold snare. Resected and  retrieved.                           - Three 2 to 5 mm polyps at the hepatic flexure,                            removed with a cold snare. Resected and retrieved.                           - Two 2 to 3 mm polyps in the ascending colon and                            in the cecum, removed with a cold snare. Resected                            and retrieved.                           - The examination was otherwise normal on direct                            and retroflexion views. Recommendation:           - Patient has a contact number available for                            emergencies. The signs and symptoms of potential                            delayed complications were discussed with the                            patient. Return to normal activities tomorrow.                            Written discharge instructions were provided to the                            patient.                           - Continue present medications.                           - Await pathology results.                           -  Repeat colonoscopy in 3 years for surveillance.                           - Referral to genetic counseling given the large                            number of polyps removed.                           - Emerging evidence supports eating a diet of                            fruits, vegetables, grains, calcium, and yogurt                            while reducing red meat and alcohol may reduce the                            risk of colon cancer.                           - Follow a high fiber diet. Drink at least 64                            ounces of water daily. Add a daily stool bulking                            agent such as psyllium (an exampled would be                            Metamucil).                           - Given these results, all first degree relatives                            (brothers, sisters, children, parents) should start                             colon cancer screening at age 63.                           - Thank you for allowing me to be involved in your                            colon cancer prevention. Thornton Park MD, MD 11/02/2022 9:50:27 AM This report has been signed electronically.

## 2022-11-02 NOTE — Progress Notes (Signed)
Dr Tarri Glenn stated that the patients first colon was aborted because the pt coughed so much.  Gave the lido and robinul to try and prevent coughing

## 2022-11-02 NOTE — Progress Notes (Signed)
Pt's states no medical or surgical changes since previsit or office visit. 

## 2022-11-02 NOTE — Progress Notes (Signed)
Called to room to assist during endoscopic procedure.  Patient ID and intended procedure confirmed with present staff. Received instructions for my participation in the procedure from the performing physician.  

## 2022-11-02 NOTE — Progress Notes (Signed)
Referring Provider: Manon Hilding, MD Primary Care Physician:  Manon Hilding, MD  Indication for Procedure:  Colon cancer Surveillance   IMPRESSION:  Need for colon cancer surveillance Appropriate candidate for monitored anesthesia care  PLAN: Colonoscopy in the [place] today   HPI: Carolyn Holmes is a 71 y.o. female presents for surveillance colonoscopy.  Screening colonoscopy with Dr. Adelina Mings 03/28/21 was incomplete due to patient instability from coughing during the procedure. Colonoscopy report mentions a tortuous colon and at least 4 polyps.   Colonoscopy 06/05/21 revealed: - Non-bleeding external and internal hemorrhoids. - Diverticulosis in the sigmoid colon and in the distal descending colon. - Three 2 to 4 mm polyps in the cecum, removed with a cold snare. Resected and retrieved. - One 10 mm polyp in the cecum, removed piecemeal using a cold snare. Resected and retrieved. - One 3 mm polyp in the mid ascending colon, removed with a cold snare. Resected and retrieved. - Six 2 to 5 mm polyps in the transverse colon and at the hepatic flexure, removed with a cold snare. Resected and retrieved. - Three 10 to 15 mm polyps in the distal sigmoid colon, removed with a cold snare. Resected and retrieved. - The examination was otherwise normal on direct and retroflexion views.  All removed polyps were tubular adenomas.    No prior attempts at colonoscopy. Son with colon polyps. No other known family history of colon cancer or polyps. No family history of uterine/endometrial cancer, pancreatic cancer or gastric/stomach cancer.   Past Medical History:  Diagnosis Date   Arthritis    KNEES   Cataract    BILATERAL,REMOVED   GERD (gastroesophageal reflux disease)    Hyperlipidemia    Hypertension     Past Surgical History:  Procedure Laterality Date   COLONOSCOPY WITH PROPOFOL N/A 06/06/2021   Procedure: COLONOSCOPY WITH PROPOFOL;  Surgeon: Thornton Park, MD;   Location: [facility];  Service: Gastroenterology;  Laterality: N/A;   HERNIA REPAIR     PARTIAL HYSTERECTOMY     POLYPECTOMY  06/06/2021   Procedure: POLYPECTOMY;  Surgeon: Thornton Park, MD;  Location: [facility];  Service: Gastroenterology;;   TRANSFORAMINAL LUMBAR INTERBODY FUSION (TLIF) WITH PEDICLE SCREW FIXATION 1 LEVEL Left 07/08/2019   Procedure: Left Lumbar Five Sacral One minimally invasive transforaminal lumbar interbody fusion with bilateral Lumbar Five Sacral One pedicle screws;  Surgeon: Judith Part, MD;  Location: [place];  Service: Neurosurgery;  Laterality: Left;  Left Lumbar Five Sacral One minimally invasive transforaminal lumbar interbody fusion with bilateral Lumbar Five Sacral One pedicle screws   WISDOM TOOTH EXTRACTION     x4    Current Outpatient Medications  Medication Sig Dispense Refill   alendronate (FOSAMAX) 70 MG tablet Take 70 mg by mouth once a week.     Calcium Carbonate-Vit D-Min (CALCIUM 1200 PO) Take by mouth daily.     rosuvastatin (CRESTOR) 10 MG tablet Take 10 mg by mouth daily.     valsartan (DIOVAN) 40 MG tablet Take 40 mg by mouth daily.     ibuprofen (ADVIL) 200 MG tablet Take 400 mg by mouth every 6 (six) hours as needed for headache or moderate pain.     meloxicam (MOBIC) 15 MG tablet Take 15 mg by mouth daily.     Current Facility-Administered Medications  Medication Dose Route Frequency Provider Last Rate Last Admin   0.9 %  sodium chloride infusion  500 mL Intravenous Once Thornton Park, MD  Allergies as of 11/02/2022 - Review Complete 11/02/2022  Allergen Reaction Noted   Oxycodone Other (See Comments) 03/03/2021   Codeine Itching 12/27/2018    Family History  Problem Relation Age of Onset   Colon cancer Neg Hx    Esophageal cancer Neg Hx    Rectal cancer Neg Hx    Stomach cancer Neg Hx    Colon polyps Neg Hx    Crohn's disease Neg Hx    Ulcerative colitis Neg Hx      Physical Exam: General:   Alert,   well-nourished, pleasant and cooperative in NAD Head:  Normocephalic and atraumatic. Eyes:  Sclera clear, no icterus.   Conjunctiva pink. Mouth:  No deformity or lesions.   Neck:  Supple; no masses or thyromegaly. Lungs:  Clear throughout to auscultation.   No wheezes. Heart:  Regular rate and rhythm; no murmurs. Abdomen:  Soft, non-tender, nondistended, normal bowel sounds, no rebound or guarding.  Msk:  Symmetrical. No boney deformities LAD: No inguinal or umbilical LAD Extremities:  No clubbing or edema. Neurologic:  Alert and  oriented x4;  grossly nonfocal Skin:  No obvious rash or bruise. Psych:  Alert and cooperative. Normal mood and affect.     Studies/Results: No results found.    Jayme Cham L. Tarri Glenn, MD, MPH 11/02/2022, 9:07 AM

## 2022-11-02 NOTE — Patient Instructions (Signed)
Thank you for letting us take care of your healthcare needs today. Please see handouts given to you on Polyps and Diverticulosis. Follow a High Fiber Diet and consider taking a Stool Bulking agent such and Metamucil and Drink atleast 64 oz of water.    YOU HAD AN ENDOSCOPIC PROCEDURE TODAY AT [facility]:   Refer to the procedure report that was given to you for any specific questions about what was found during the examination.  If the procedure report does not answer your questions, please call your gastroenterologist to clarify.  If you requested that your care partner not be given the details of your procedure findings, then the procedure report has been included in a sealed envelope for you to review at your convenience later.  YOU SHOULD EXPECT: Some feelings of bloating in the abdomen. Passage of more gas than usual.  Walking can help get rid of the air that was put into your GI tract during the procedure and reduce the bloating. If you had a lower endoscopy (such as a colonoscopy or flexible sigmoidoscopy) you may notice spotting of blood in your stool or on the toilet paper. If you underwent a bowel prep for your procedure, you may not have a normal bowel movement for a few days.  Please Note:  You might notice some irritation and congestion in your nose or some drainage.  This is from the oxygen used during your procedure.  There is no need for concern and it should clear up in a day or so.  SYMPTOMS TO REPORT IMMEDIATELY:  Following lower endoscopy (colonoscopy or flexible sigmoidoscopy):  Excessive amounts of blood in the stool  Significant tenderness or worsening of abdominal pains  Swelling of the abdomen that is new, acute  Fever of 100F or higher   For urgent or emergent issues, a gastroenterologist can be reached at any hour by calling [phone]. Do not use MyChart messaging for urgent concerns.    DIET:  We do recommend a small meal at first, but then  you may proceed to your regular diet.  Drink plenty of fluids but you should avoid alcoholic beverages for 24 hours.  ACTIVITY:  You should plan to take it easy for the rest of today and you should NOT DRIVE or use heavy machinery until tomorrow (because of the sedation medicines used during the test).    FOLLOW UP: Our staff will call the number listed on your records the next business day following your procedure.  We will call around 7:15- 8:00 am to check on you and address any questions or concerns that you may have regarding the information given to you following your procedure. If we do not reach you, we will leave a message.     If any biopsies were taken you will be contacted by phone or by letter within the next 1-3 weeks.  Please call us at [phone] if you have not heard about the biopsies in 3 weeks.    SIGNATURES/CONFIDENTIALITY: You and/or your care partner have signed paperwork which will be entered into your electronic medical record.  These signatures attest to the fact that that the information above on your After Visit Summary has been reviewed and is understood.  Full responsibility of the confidentiality of this discharge information lies with you and/or your care-partner.

## 2022-11-02 NOTE — Telephone Encounter (Signed)
-----   Message from [place], MD sent at 11/02/2022  9:54 AM EST ----- Please refer to genetic counseling. >10 polyps removed over the last year.  Thanks.  KLB

## 2022-11-02 NOTE — Progress Notes (Signed)
Report to PACU, RN, vss, BBS= Clear.  

## 2022-11-03 ENCOUNTER — Telehealth: Payer: Self-pay

## 2022-11-03 NOTE — Telephone Encounter (Signed)
  Follow up Call-     11/02/2022    8:19 AM  Call back number  Post procedure Call Back phone  # [phone]  Permission to leave phone message Yes     Patient questions:  Do you have a fever, pain , or abdominal swelling? No. Pain Score  0 *  Have you tolerated food without any problems? Yes.    Have you been able to return to your normal activities? Yes.    Do you have any questions about your discharge instructions: Diet   No. Medications  No. Follow up visit  No.  Do you have questions or concerns about your Care? No.  Actions: * If pain score is 4 or above: No action needed, pain <4.

## 2022-11-16 ENCOUNTER — Encounter: Payer: Self-pay | Admitting: Gastroenterology

## 2023-01-15 ENCOUNTER — Inpatient Hospital Stay: Payer: Medicare Other

## 2023-01-15 ENCOUNTER — Inpatient Hospital Stay: Payer: Medicare Other | Admitting: Licensed Clinical Social Worker

## 2023-03-08 ENCOUNTER — Inpatient Hospital Stay: Payer: Medicare Other

## 2023-03-08 ENCOUNTER — Other Ambulatory Visit: Payer: Self-pay

## 2023-03-08 ENCOUNTER — Inpatient Hospital Stay: Payer: Medicare Other | Attending: Genetic Counselor | Admitting: Genetic Counselor

## 2023-03-08 DIAGNOSIS — Z860100 Personal history of colon polyps, unspecified: Secondary | ICD-10-CM | POA: Insufficient documentation

## 2023-03-08 LAB — GENETIC SCREENING ORDER

## 2023-03-08 NOTE — Progress Notes (Signed)
REFERRING PROVIDER: Tressia Danas, MD [address] [place],  [place] [address]   PRIMARY PROVIDER:  Estanislado Pandy, MD  PRIMARY REASON FOR VISIT:  Encounter Diagnosis  Name Primary?   Personal history of colonic polyps Yes     HISTORY OF PRESENT ILLNESS:   Carolyn Holmes, a 72 y.o. female, was seen for a  cancer genetics consultation at the request of Dr. Orvan Falconer due to a personal history of colon polyps.  Carolyn Holmes presents to clinic today to discuss the possibility of a hereditary predisposition to cancer, to discuss genetic testing, and to further clarify her future cancer risks, as well as potential cancer risks for family members.   Carolyn Holmes is a 72 y.o. female with no personal history of cancer.  She has a personal history of ~20 tubular adenomas.  Colonoscopy in December 2023 revealed six tubular adenomas and four hyperplastic polyps.  Colonoscopy in July 2022 revealed 14 tubular adenomas.  GI recommended follow-up colonoscopy in 3 years.   CANCER HISTORY:  Oncology History   No history exists.    RISK FACTORS:  Mammogram within the last year: no Number of breast biopsies: 0. Colonoscopy: yes;  see history noted above . Hysterectomy: partial hysterectomy ~ 2 year ago (prolapse) Ovaries intact: yes.  Menarche--unknown age.  About avg per patient.  First live birth at age 28.  Menopausal status: postmenopausal.  HRT use: 0 years. Dermatology screening: no  Past Medical History:  Diagnosis Date   Arthritis    KNEES   Cataract    BILATERAL,REMOVED   GERD (gastroesophageal reflux disease)    Hyperlipidemia    Hypertension     Past Surgical History:  Procedure Laterality Date   COLONOSCOPY WITH PROPOFOL N/A 06/06/2021   Procedure: COLONOSCOPY WITH PROPOFOL;  Surgeon: Tressia Danas, MD;  Location: [facility];  Service: Gastroenterology;  Laterality: N/A;   HERNIA REPAIR     PARTIAL HYSTERECTOMY     POLYPECTOMY  06/06/2021   Procedure:  POLYPECTOMY;  Surgeon: Tressia Danas, MD;  Location: [facility];  Service: Gastroenterology;;   TRANSFORAMINAL LUMBAR INTERBODY FUSION (TLIF) WITH PEDICLE SCREW FIXATION 1 LEVEL Left 07/08/2019   Procedure: Left Lumbar Five Sacral One minimally invasive transforaminal lumbar interbody fusion with bilateral Lumbar Five Sacral One pedicle screws;  Surgeon: Jadene Pierini, MD;  Location: [facility] OR;  Service: Neurosurgery;  Laterality: Left;  Left Lumbar Five Sacral One minimally invasive transforaminal lumbar interbody fusion with bilateral Lumbar Five Sacral One pedicle screws   WISDOM TOOTH EXTRACTION     x4    FAMILY HISTORY:  We obtained a detailed, 4-generation family history.  She did not report any known family history of colon polyps or cancer.     Carolyn Holmes is unaware of previous family history of genetic testing for hereditary cancer risks. There is no reported Ashkenazi Jewish ancestry. There is no known consanguinity.  GENETIC COUNSELING ASSESSMENT: Carolyn Holmes is a 72 y.o. female with a personal history which is somewhat suggestive of a hereditary polyposis condition, given her history of ~20 tubular adenomas. We, therefore, discussed and recommended the following at today's visit.   DISCUSSION: DISCUSSION: We discussed that polyps in general are common, however, most people have fewer than 5 lifetime polyps.  When an individual has 10 or more polyps we become concerned about an underlying polyposis syndrome.  The most common hereditary polyposis syndromes are Familial Adenomatous Polyposis (FAP), caused by mutations in the APC gene, and MUTYH-Associated Polyposis (MAP), caused  by mutations in the MUTYH gene.  There are other genes that are associated with polyposis, such as NTHL1 and MSH3.  We discussed that testing is beneficial for several reasons, including knowing about cancer risks, identifying potential screening and risk-reduction options that may be appropriate, and to  understand if other family members could be at risk for colon polyps and/or cancer and allow them to undergo genetic testing.   We reviewed the characteristics, features and inheritance patterns of hereditary cancer syndromes. We also discussed genetic testing, including the appropriate family members to test, the process of testing, insurance coverage and turn-around-time for results. We discussed the implications of a negative, positive and/or variant of uncertain significant result. We recommended Gelardi pursue genetic testing for genes associated with colon cancer and polyps as well as other cancers.  Carolyn Holmes was offered a common hereditary cancer panel (48 genes) and an expanded pan-cancer panel (70 genes). Carolyn Holmes was informed of the benefits and limitations of each panel, including that expanded pan-cancer panels contain several genes that do not have clear management guidelines at this point in time.  We also discussed that as the number of genes included on a panel increases, the chances of variants of uncertain significance increases.  After considering the benefits and limitations of each gene panel, Carolyn Holmes elected to have a common hereditary cancers panel through [facility].    The [facility] Common Hereditary Cancers + RNA Panel includes sequencing, deletion/duplication, and RNA analysis of the following 48 genes: APC, ATM, AXIN2, BAP1, BARD1, BMPR1A, BRCA1, BRCA2, BRIP1, CDH1, CDK4*, CDKN2A*, CHEK2, CTNNA1, DICER1, EPCAM* (del/dup only), FH, GREM1* (promoter dup analysis only), HOXB13*, KIT*, MBD4*, MEN1, MLH1, MSH2, MSH3, MSH6, MUTYH, NF1, NTHL1, PALB2, PDGFRA*, PMS2, POLD1, POLE, PTEN, RAD51C, RAD51D, SDHA (sequencing only), SDHB, SDHC, SDHD, SMAD4, SMARCA4, STK11, TP53, TSC1, TSC2, VHL.  *Genes without RNA analysis.   Based on Carolyn Holmes's personal history of 20 tubular adenomas, she meets medical criteria for genetic testing.   We discussed the Genetic Information  Non-Discrimination Act (GINA) of 2008, which helps protect individuals against genetic discrimination based on their genetic test results.  It impacts both health insurance and employment.  With health insurance, it protects against genetic test results being used for increased premiums or policy termination. For employment, it protects against hiring, firing and promoting decisions based on genetic test results.  GINA does not apply to those in the [employer], those who work for companies with less than 15 employees, and new life insurance or long-term disability insurance policies.  Health status due to a cancer diagnosis is not protected under GINA.  PLAN: After considering the risks, benefits, and limitations, Carolyn Holmes provided informed consent to pursue genetic testing and the blood sample was sent to [facility] for analysis of the Common Hereditary Cancers +RNA Panel. Results should be available within approximately 3 weeks' time, at which point they will be disclosed by telephone to Carolyn Holmes, as will any additional recommendations warranted by these results. Carolyn Holmes will receive a summary of her genetic counseling visit and a copy of her results once available. This information will also be available in Epic.   Carolyn Holmes questions were answered to her satisfaction today. Our contact information was provided should additional questions or concerns arise. Thank you for the referral and allowing [place] to share in the care of your patient.   Carolyn Holmes M. Rennie Plowman, MS, [facility] Genetic Counselor [email or web address] (P) [phone]   The patient was seen for a total of  20 minutes in face-to-face genetic counseling.  She was was seen alone.  Drs. Pamelia Hoit and/or Mosetta Putt were available to discuss this case as needed.  _______________________________________________________________________ For Office Staff:  Number of people involved in session: 1 Was an Intern/ student involved with  case: no

## 2023-03-09 DIAGNOSIS — E7801 Familial hypercholesterolemia: Secondary | ICD-10-CM | POA: Diagnosis not present

## 2023-03-09 DIAGNOSIS — R739 Hyperglycemia, unspecified: Secondary | ICD-10-CM | POA: Diagnosis not present

## 2023-03-09 DIAGNOSIS — I10 Essential (primary) hypertension: Secondary | ICD-10-CM | POA: Diagnosis not present

## 2023-03-09 DIAGNOSIS — E7849 Other hyperlipidemia: Secondary | ICD-10-CM | POA: Diagnosis not present

## 2023-03-14 DIAGNOSIS — R7301 Impaired fasting glucose: Secondary | ICD-10-CM | POA: Diagnosis not present

## 2023-03-14 DIAGNOSIS — Z720 Tobacco use: Secondary | ICD-10-CM | POA: Diagnosis not present

## 2023-03-14 DIAGNOSIS — Z0001 Encounter for general adult medical examination with abnormal findings: Secondary | ICD-10-CM | POA: Diagnosis not present

## 2023-03-14 DIAGNOSIS — Z6830 Body mass index (BMI) 30.0-30.9, adult: Secondary | ICD-10-CM | POA: Diagnosis not present

## 2023-03-14 DIAGNOSIS — Z23 Encounter for immunization: Secondary | ICD-10-CM | POA: Diagnosis not present

## 2023-03-14 DIAGNOSIS — F325 Major depressive disorder, single episode, in full remission: Secondary | ICD-10-CM | POA: Diagnosis not present

## 2023-03-14 DIAGNOSIS — E6609 Other obesity due to excess calories: Secondary | ICD-10-CM | POA: Diagnosis not present

## 2023-03-14 DIAGNOSIS — I10 Essential (primary) hypertension: Secondary | ICD-10-CM | POA: Diagnosis not present

## 2023-03-14 DIAGNOSIS — R42 Dizziness and giddiness: Secondary | ICD-10-CM | POA: Diagnosis not present

## 2023-03-14 DIAGNOSIS — E7849 Other hyperlipidemia: Secondary | ICD-10-CM | POA: Diagnosis not present

## 2023-04-02 ENCOUNTER — Encounter: Payer: Self-pay | Admitting: Genetic Counselor

## 2023-04-02 ENCOUNTER — Telehealth: Payer: Self-pay | Admitting: Genetic Counselor

## 2023-04-02 DIAGNOSIS — Z1379 Encounter for other screening for genetic and chromosomal anomalies: Secondary | ICD-10-CM | POA: Insufficient documentation

## 2023-04-02 NOTE — Telephone Encounter (Signed)
Contacted patient in attempt to disclose results of genetic testing.  LVM with contact information requesting a call back.  

## 2023-04-03 ENCOUNTER — Ambulatory Visit: Payer: Self-pay | Admitting: Genetic Counselor

## 2023-04-03 DIAGNOSIS — Z86010 Personal history of colonic polyps: Secondary | ICD-10-CM

## 2023-04-03 DIAGNOSIS — Z1379 Encounter for other screening for genetic and chromosomal anomalies: Secondary | ICD-10-CM

## 2023-04-03 NOTE — Telephone Encounter (Signed)
Disclosed negative genetic testing results.  Patient declined submitting additional sample for RNA analysis.

## 2023-04-03 NOTE — Progress Notes (Signed)
HPI:   Carolyn Holmes was previously seen in the [facility] due to a personal history of colon polyps and concerns regarding a hereditary predisposition to cancer.    Carolyn Holmes recent genetic test results were disclosed to her by telephone. These results and recommendations are discussed in more detail below.  CANCER HISTORY:  Carolyn Holmes is a 72 y.o. female with no personal history of cancer.  She has a personal history of ~20 tubular adenomas.  Colonoscopy in December 2023 revealed six tubular adenomas and four hyperplastic polyps.  Colonoscopy in July 2022 revealed 14 tubular adenomas.  GI recommended follow-up colonoscopy in 3 years.    FAMILY HISTORY:  We obtained a detailed, 4-generation family history.  She did not report any known family history of colon polyps or cancer.       Carolyn Holmes is unaware of previous family history of genetic testing for hereditary cancer risks. There is no reported Ashkenazi Jewish ancestry. There is no known consanguinity.  GENETIC TEST RESULTS:  The [facility] Common Hereditary Cancers Panel found no pathogenic mutations.  The [facility] Common Hereditary Cancers Panel includes sequencing, deletion/duplication, and RNA analysis of the following 48 genes: APC, ATM, AXIN2, BAP1, BARD1, BMPR1A, BRCA1, BRCA2, BRIP1, CDH1, CDK4*, CDKN2A*, CHEK2, CTNNA1, DICER1, EPCAM* (del/dup only), FH, GREM1* (promoter dup analysis only), HOXB13*, KIT*, MBD4*, MEN1, MLH1, MSH2, MSH3, MSH6, MUTYH, NF1, NTHL1, PALB2, PDGFRA*, PMS2, POLD1, POLE, PTEN, RAD51C, RAD51D, SDHA (sequencing only), SDHB, SDHC, SDHD, SMAD4, SMARCA4, STK11, TP53, TSC1, TSC2, VHL.  Marland Kitchen   The test report has been scanned into EPIC and is located under the Molecular Pathology section of the Results Review tab.  A portion of the result report is included below for reference. Genetic testing reported out on March 14, 2023.       Even though a pathogenic variant was not identified,  possible explanations for Carolyn Holmes's colon polyps include:  There may be no hereditary risk for cancer/polyps in the family. The polyps in Carolyn Holmes and/or her family may be sporadic/familial or due to other genetic and environmental factors. There may be a gene mutation in one of these genes that current testing methods cannot detect but that chance is small. There could be another gene that has not yet been discovered, or that we have not yet tested, that is responsible for the cancer diagnoses in the family.    Therefore, it is important to remain in touch with cancer genetics in the future so that we can continue to offer Carolyn Holmes the most up to date genetic testing.    ADDITIONAL GENETIC TESTING:  RNA analysis on Carolyn Holmes's sample was unable to be completed due to sample failure in the lab.  We discussed that in rare cases, RNA anlaysis can detect deep intronic variants missed by DNA analysis alone.  Carolyn Holmes declined submitting an additional sample for RNA analysis.   If there are additional relevant genes identified to increase cancer risk that can be analyzed in the future, we would be happy to discuss and coordinate this testing at that time.     CANCER SCREENING RECOMMENDATIONS:  Carolyn Holmes test result is considered negative (normal).  This means that we have not identified a hereditary cause for her personal history of colon polyps at this time.    An individual's cancer risk and medical management are not determined by genetic test results alone. Overall cancer risk assessment incorporates additional factors, including personal medical history, family history,  and any available genetic information that may result in a personalized plan for cancer prevention and surveillance. Therefore, it is recommended she continue to follow the cancer management and screening guidelines provided by her gastroenterologist and primary healthcare provider.   RECOMMENDATIONS FOR  FAMILY MEMBERS:   Since she did not inherit a identifiable mutation in a cancer predisposition gene included on this panel, her children could not have inherited a known mutation from her in one of these genes. Individuals in this family might be at some increased risk of developing cancer, over the general population risk, due to the family history of cancer.  Individuals in the family should notify their providers of the family history of polyps and have colonoscopies as recommended by their GI providers.   FOLLOW-UP:  Cancer genetics is a rapidly advancing field and it is possible that new genetic tests will be appropriate for her and/or her family members in the future. We encourage Carolyn Holmes to remain in contact with cancer genetics, so we can update her personal and family histories and let her know of advances in cancer genetics that may benefit this family.   Our contact number was provided.. She knows she is welcome to call us at anytime with additional questions or concerns.   Cayce Quezada M. Rennie Plowman, MS, [facility] Genetic Counselor [email or web address] (P) [phone]

## 2023-08-06 DIAGNOSIS — H35363 Drusen (degenerative) of macula, bilateral: Secondary | ICD-10-CM | POA: Diagnosis not present

## 2023-08-06 DIAGNOSIS — H3412 Central retinal artery occlusion, left eye: Secondary | ICD-10-CM | POA: Diagnosis not present

## 2023-08-06 DIAGNOSIS — H40013 Open angle with borderline findings, low risk, bilateral: Secondary | ICD-10-CM | POA: Diagnosis not present

## 2023-08-06 DIAGNOSIS — H26493 Other secondary cataract, bilateral: Secondary | ICD-10-CM | POA: Diagnosis not present

## 2023-08-10 DIAGNOSIS — F17210 Nicotine dependence, cigarettes, uncomplicated: Secondary | ICD-10-CM | POA: Diagnosis not present

## 2023-08-10 DIAGNOSIS — H3412 Central retinal artery occlusion, left eye: Secondary | ICD-10-CM | POA: Diagnosis not present

## 2023-08-17 DIAGNOSIS — I700 Atherosclerosis of aorta: Secondary | ICD-10-CM | POA: Diagnosis not present

## 2023-08-17 DIAGNOSIS — I071 Rheumatic tricuspid insufficiency: Secondary | ICD-10-CM | POA: Diagnosis not present

## 2023-08-17 DIAGNOSIS — I3481 Nonrheumatic mitral (valve) annulus calcification: Secondary | ICD-10-CM | POA: Diagnosis not present

## 2023-08-17 DIAGNOSIS — H3412 Central retinal artery occlusion, left eye: Secondary | ICD-10-CM | POA: Diagnosis not present

## 2023-08-17 DIAGNOSIS — I6523 Occlusion and stenosis of bilateral carotid arteries: Secondary | ICD-10-CM | POA: Diagnosis not present

## 2023-08-20 DIAGNOSIS — I358 Other nonrheumatic aortic valve disorders: Secondary | ICD-10-CM | POA: Diagnosis not present

## 2023-08-20 DIAGNOSIS — H3412 Central retinal artery occlusion, left eye: Secondary | ICD-10-CM | POA: Diagnosis not present

## 2023-08-20 DIAGNOSIS — I3481 Nonrheumatic mitral (valve) annulus calcification: Secondary | ICD-10-CM | POA: Diagnosis not present

## 2023-08-23 DIAGNOSIS — E7849 Other hyperlipidemia: Secondary | ICD-10-CM | POA: Diagnosis not present

## 2023-08-23 DIAGNOSIS — Z23 Encounter for immunization: Secondary | ICD-10-CM | POA: Diagnosis not present

## 2023-08-23 DIAGNOSIS — E6609 Other obesity due to excess calories: Secondary | ICD-10-CM | POA: Diagnosis not present

## 2023-08-23 DIAGNOSIS — R7301 Impaired fasting glucose: Secondary | ICD-10-CM | POA: Diagnosis not present

## 2023-08-23 DIAGNOSIS — Z6830 Body mass index (BMI) 30.0-30.9, adult: Secondary | ICD-10-CM | POA: Diagnosis not present

## 2023-08-23 DIAGNOSIS — H3412 Central retinal artery occlusion, left eye: Secondary | ICD-10-CM | POA: Diagnosis not present

## 2023-08-23 DIAGNOSIS — R42 Dizziness and giddiness: Secondary | ICD-10-CM | POA: Diagnosis not present

## 2023-08-23 DIAGNOSIS — I10 Essential (primary) hypertension: Secondary | ICD-10-CM | POA: Diagnosis not present

## 2023-08-23 DIAGNOSIS — F325 Major depressive disorder, single episode, in full remission: Secondary | ICD-10-CM | POA: Diagnosis not present

## 2023-08-23 DIAGNOSIS — Z720 Tobacco use: Secondary | ICD-10-CM | POA: Diagnosis not present

## 2023-09-06 DIAGNOSIS — H26493 Other secondary cataract, bilateral: Secondary | ICD-10-CM | POA: Diagnosis not present

## 2023-09-06 DIAGNOSIS — H35363 Drusen (degenerative) of macula, bilateral: Secondary | ICD-10-CM | POA: Diagnosis not present

## 2023-09-06 DIAGNOSIS — H3412 Central retinal artery occlusion, left eye: Secondary | ICD-10-CM | POA: Diagnosis not present

## 2023-09-06 DIAGNOSIS — H40013 Open angle with borderline findings, low risk, bilateral: Secondary | ICD-10-CM | POA: Diagnosis not present

## 2023-09-13 ENCOUNTER — Encounter: Payer: Self-pay | Admitting: Vascular Surgery

## 2023-09-13 ENCOUNTER — Ambulatory Visit (INDEPENDENT_AMBULATORY_CARE_PROVIDER_SITE_OTHER): Payer: Medicare Other | Admitting: Vascular Surgery

## 2023-09-13 VITALS — BP 180/96 | HR 97 | Temp 98.0°F | Ht 61.0 in | Wt 158.4 lb

## 2023-09-13 DIAGNOSIS — I771 Stricture of artery: Secondary | ICD-10-CM

## 2023-09-13 NOTE — Progress Notes (Signed)
Patient ID: Carolyn Holmes, female   DOB: 01/11/1951, 72 y.o.   MRN: [id number]  Reason for Consult: No chief complaint on file.   Referred by Estanislado Pandy, MD  Subjective:     HPI:  Carolyn Holmes is a 72 y.o. female with history of central retinal artery occlusion underwent carotid duplex for evaluation.  Unfortunately she has lost most of the vision in her left eye and this has not returned.  She has a history of hyperlipidemia and hypertension has never had any stroke, TIA or amaurosis.  He is right-hand dominant she uses her left hand without any limitation does not have any wounds.  She does have occasional dizziness but not related to using her left arm.  She walks without limitation.  She continues to smoke daily but has significantly cut back since her central retinal artery occlusion.  Past Medical History:  Diagnosis Date   Arthritis    KNEES   Cataract    BILATERAL,REMOVED   GERD (gastroesophageal reflux disease)    Hyperlipidemia    Hypertension    Family History  Problem Relation Age of Onset   Colon cancer Neg Hx    Esophageal cancer Neg Hx    Rectal cancer Neg Hx    Stomach cancer Neg Hx    Colon polyps Neg Hx    Crohn's disease Neg Hx    Ulcerative colitis Neg Hx    Past Surgical History:  Procedure Laterality Date   COLONOSCOPY WITH PROPOFOL N/A 06/06/2021   Procedure: COLONOSCOPY WITH PROPOFOL;  Surgeon: Tressia Danas, MD;  Location: [facility];  Service: Gastroenterology;  Laterality: N/A;   HERNIA REPAIR     PARTIAL HYSTERECTOMY     POLYPECTOMY  06/06/2021   Procedure: POLYPECTOMY;  Surgeon: Tressia Danas, MD;  Location: [facility];  Service: Gastroenterology;;   TRANSFORAMINAL LUMBAR INTERBODY FUSION (TLIF) WITH PEDICLE SCREW FIXATION 1 LEVEL Left 07/08/2019   Procedure: Left Lumbar Five Sacral One minimally invasive transforaminal lumbar interbody fusion with bilateral Lumbar Five Sacral One pedicle screws;  Surgeon: Jadene Pierini, MD;  Location: [facility];  Service: Neurosurgery;  Laterality: Left;  Left Lumbar Five Sacral One minimally invasive transforaminal lumbar interbody fusion with bilateral Lumbar Five Sacral One pedicle screws   WISDOM TOOTH EXTRACTION     x4    Short Social History:  Social History   Tobacco Use   Smoking status: Some Days    Current packs/day: 0.25    Average packs/day: 0.3 packs/day for 25.0 years (6.3 ttl pk-yrs)    Types: Cigarettes    Passive exposure: Past   Smokeless tobacco: Never   Tobacco comments:    Pt stopped smoking May 2022 - Before her colonoscopy at [facility].  She reported she smoked 2 cigs yesterday.    Substance Use Topics   Alcohol use: Yes    Comment: rarely    Allergies  Allergen Reactions   Oxycodone Other (See Comments)    Did not like the way it made it her feel   Codeine Itching    Current Outpatient Medications  Medication Sig Dispense Refill   alendronate (FOSAMAX) 70 MG tablet Take 70 mg by mouth once a week.     Calcium Carbonate-Vit D-Min (CALCIUM 1200 PO) Take by mouth daily.     ibuprofen (ADVIL) 200 MG tablet Take 400 mg by mouth every 6 (six) hours as needed for headache or moderate pain.     meloxicam (MOBIC) 15 MG tablet  Take 15 mg by mouth daily.     rosuvastatin (CRESTOR) 10 MG tablet Take 10 mg by mouth daily.     valsartan (DIOVAN) 40 MG tablet Take 40 mg by mouth daily.     No current facility-administered medications for this visit.    Review of Systems  Constitutional:  Constitutional negative. HENT: HENT negative.  Eyes: Positive for loss of vision and visual disturbance.   Respiratory: Respiratory negative.  Cardiovascular: Cardiovascular negative.  GI: Gastrointestinal negative.  Musculoskeletal: Musculoskeletal negative.  Skin: Skin negative.  Neurological: Positive for dizziness.  Hematologic: Hematologic/lymphatic negative.  Psychiatric: Psychiatric negative.        Objective:  Objective  Vitals:   09/13/23 0922   BP: (!) 180/96  Pulse: 97  Temp: 98 F (36.7 C)  SpO2: 96%     Physical Exam HENT:     Head: Normocephalic.  Eyes:     Pupils: Pupils are equal, round, and reactive to light.  Neck:     Vascular: No carotid bruit.     Comments: Left supraclavicular bruit Cardiovascular:     Rate and Rhythm: Normal rate.     Pulses:          Radial pulses are 2+ on the right side and 2+ on the left side.  Pulmonary:     Effort: Pulmonary effort is normal.  Abdominal:     General: Abdomen is flat.  Musculoskeletal:     Right lower leg: No edema.     Left lower leg: No edema.  Skin:    General: Skin is warm.     Capillary Refill: Capillary refill takes less than 2 seconds.  Neurological:     General: No focal deficit present.     Mental Status: She is alert.  Psychiatric:        Mood and Affect: Mood normal.     Data: EXAM: BILATERAL CAROTID DUPLEX ULTRASOUND  TECHNIQUE: Wallace Cullens scale imaging, color Doppler and duplex ultrasound were performed of bilateral carotid and vertebral arteries in the neck.  COMPARISON: None Available.  FINDINGS: Criteria: Quantification of carotid stenosis is based on velocity parameters that correlate the residual internal carotid diameter with NASCET-based stenosis levels, using the diameter of the distal internal carotid lumen as the denominator for stenosis measurement.  The following velocity measurements were obtained:  RIGHT  ICA: 72/26 cm/sec  CCA: 92/20 cm/sec  SYSTOLIC ICA/CCA RATIO: 0.8  ECA: 91 cm/sec  LEFT  ICA: 44/14 cm/sec  CCA: 82/22 cm/sec  SYSTOLIC ICA/CCA RATIO: 0.5  ECA: 111 cm/sec  RIGHT CAROTID ARTERY: There is a minimal to moderate amount of eccentric echogenic plaque within the right carotid bulb, extending to involve the origin and proximal aspects of the right internal carotid artery (image 13), not resulting in elevated peak systolic velocities within the interrogated course of the right internal carotid  artery to suggest a hemodynamically significant stenosis.  RIGHT VERTEBRAL ARTERY: Antegrade flow  LEFT CAROTID ARTERY: There is a minimal amount of intimal thickening/atherosclerotic plaque scattered throughout the left common carotid artery. There are no elevated peak systolic velocities within the interrogated course of the left internal carotid artery to suggest a hemodynamically significant stenosis.  LEFT VERTEBRAL ARTERY: Biphasic flow (image 48)  Upper extremity blood pressures: RIGHT: 192/118 LEFT: 129/98  Right subclavian artery: 210 centimeters/seconds  Left subclavian artery: Greater than 536 centimeters/seconds  IMPRESSION: 1. Minimal to moderate amount of right-sided atherosclerotic plaque, not resulting in hemodynamically significant stenosis. 2. Minimal amount of left-sided atherosclerotic  plaque, not resulting in hemodynamically significant stenosis. 3. 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 Further evaluation with CTA could be performed as indicated.      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  I discussed the need to continue aspirin and for full smoking cessation but as she remains asymptomatic from a subclavian issue I would not recommend any intervention at this time.  She will follow-up in 1 year with repeat carotid duplex.  Maeola Harman MD [facility]

## 2023-10-03 DIAGNOSIS — I739 Peripheral vascular disease, unspecified: Secondary | ICD-10-CM | POA: Diagnosis not present

## 2023-10-04 DIAGNOSIS — R7301 Impaired fasting glucose: Secondary | ICD-10-CM | POA: Diagnosis not present

## 2023-10-04 DIAGNOSIS — E7849 Other hyperlipidemia: Secondary | ICD-10-CM | POA: Diagnosis not present

## 2023-10-04 DIAGNOSIS — Z720 Tobacco use: Secondary | ICD-10-CM | POA: Diagnosis not present

## 2023-10-04 DIAGNOSIS — E7801 Familial hypercholesterolemia: Secondary | ICD-10-CM | POA: Diagnosis not present

## 2023-10-04 DIAGNOSIS — I10 Essential (primary) hypertension: Secondary | ICD-10-CM | POA: Diagnosis not present

## 2023-10-10 DIAGNOSIS — Z720 Tobacco use: Secondary | ICD-10-CM | POA: Diagnosis not present

## 2023-10-10 DIAGNOSIS — E6609 Other obesity due to excess calories: Secondary | ICD-10-CM | POA: Diagnosis not present

## 2023-10-10 DIAGNOSIS — R7301 Impaired fasting glucose: Secondary | ICD-10-CM | POA: Diagnosis not present

## 2023-10-10 DIAGNOSIS — I10 Essential (primary) hypertension: Secondary | ICD-10-CM | POA: Diagnosis not present

## 2023-10-10 DIAGNOSIS — H3412 Central retinal artery occlusion, left eye: Secondary | ICD-10-CM | POA: Diagnosis not present

## 2023-10-10 DIAGNOSIS — R42 Dizziness and giddiness: Secondary | ICD-10-CM | POA: Diagnosis not present

## 2023-10-10 DIAGNOSIS — F325 Major depressive disorder, single episode, in full remission: Secondary | ICD-10-CM | POA: Diagnosis not present

## 2023-10-10 DIAGNOSIS — Z6831 Body mass index (BMI) 31.0-31.9, adult: Secondary | ICD-10-CM | POA: Diagnosis not present

## 2023-10-10 DIAGNOSIS — E7849 Other hyperlipidemia: Secondary | ICD-10-CM | POA: Diagnosis not present

## 2023-10-17 DIAGNOSIS — H40013 Open angle with borderline findings, low risk, bilateral: Secondary | ICD-10-CM | POA: Diagnosis not present

## 2023-10-17 DIAGNOSIS — H26493 Other secondary cataract, bilateral: Secondary | ICD-10-CM | POA: Diagnosis not present

## 2023-11-28 DIAGNOSIS — J441 Chronic obstructive pulmonary disease with (acute) exacerbation: Secondary | ICD-10-CM | POA: Diagnosis not present

## 2023-11-28 DIAGNOSIS — R030 Elevated blood-pressure reading, without diagnosis of hypertension: Secondary | ICD-10-CM | POA: Diagnosis not present

## 2023-11-28 DIAGNOSIS — F17210 Nicotine dependence, cigarettes, uncomplicated: Secondary | ICD-10-CM | POA: Diagnosis not present

## 2023-11-28 DIAGNOSIS — Z6829 Body mass index (BMI) 29.0-29.9, adult: Secondary | ICD-10-CM | POA: Diagnosis not present

## 2023-11-28 DIAGNOSIS — R059 Cough, unspecified: Secondary | ICD-10-CM | POA: Diagnosis not present

## 2023-11-28 DIAGNOSIS — R509 Fever, unspecified: Secondary | ICD-10-CM | POA: Diagnosis not present

## 2024-01-31 DIAGNOSIS — I10 Essential (primary) hypertension: Secondary | ICD-10-CM | POA: Diagnosis not present

## 2024-01-31 DIAGNOSIS — R7301 Impaired fasting glucose: Secondary | ICD-10-CM | POA: Diagnosis not present

## 2024-01-31 DIAGNOSIS — R739 Hyperglycemia, unspecified: Secondary | ICD-10-CM | POA: Diagnosis not present

## 2024-01-31 DIAGNOSIS — J441 Chronic obstructive pulmonary disease with (acute) exacerbation: Secondary | ICD-10-CM | POA: Diagnosis not present

## 2024-02-04 DIAGNOSIS — J9811 Atelectasis: Secondary | ICD-10-CM | POA: Diagnosis not present

## 2024-02-04 DIAGNOSIS — S22068A Other fracture of T7-T8 thoracic vertebra, initial encounter for closed fracture: Secondary | ICD-10-CM | POA: Diagnosis not present

## 2024-02-04 DIAGNOSIS — R0602 Shortness of breath: Secondary | ICD-10-CM | POA: Diagnosis not present

## 2024-02-04 DIAGNOSIS — N2889 Other specified disorders of kidney and ureter: Secondary | ICD-10-CM | POA: Diagnosis not present

## 2024-02-04 DIAGNOSIS — Z20822 Contact with and (suspected) exposure to covid-19: Secondary | ICD-10-CM | POA: Diagnosis not present

## 2024-02-04 DIAGNOSIS — S90512A Abrasion, left ankle, initial encounter: Secondary | ICD-10-CM | POA: Diagnosis not present

## 2024-02-04 DIAGNOSIS — H5462 Unqualified visual loss, left eye, normal vision right eye: Secondary | ICD-10-CM | POA: Diagnosis not present

## 2024-02-04 DIAGNOSIS — S225XXA Flail chest, initial encounter for closed fracture: Secondary | ICD-10-CM | POA: Diagnosis not present

## 2024-02-04 DIAGNOSIS — F17210 Nicotine dependence, cigarettes, uncomplicated: Secondary | ICD-10-CM | POA: Diagnosis not present

## 2024-02-04 DIAGNOSIS — I708 Atherosclerosis of other arteries: Secondary | ICD-10-CM | POA: Diagnosis not present

## 2024-02-04 DIAGNOSIS — E119 Type 2 diabetes mellitus without complications: Secondary | ICD-10-CM | POA: Diagnosis not present

## 2024-02-04 DIAGNOSIS — S0990XA Unspecified injury of head, initial encounter: Secondary | ICD-10-CM | POA: Diagnosis not present

## 2024-02-04 DIAGNOSIS — R0902 Hypoxemia: Secondary | ICD-10-CM | POA: Diagnosis present

## 2024-02-04 DIAGNOSIS — K219 Gastro-esophageal reflux disease without esophagitis: Secondary | ICD-10-CM | POA: Diagnosis not present

## 2024-02-04 DIAGNOSIS — S271XXA Traumatic hemothorax, initial encounter: Secondary | ICD-10-CM | POA: Diagnosis not present

## 2024-02-04 DIAGNOSIS — R251 Tremor, unspecified: Secondary | ICD-10-CM | POA: Diagnosis not present

## 2024-02-04 DIAGNOSIS — S272XXA Traumatic hemopneumothorax, initial encounter: Secondary | ICD-10-CM | POA: Diagnosis not present

## 2024-02-04 DIAGNOSIS — R0989 Other specified symptoms and signs involving the circulatory and respiratory systems: Secondary | ICD-10-CM | POA: Diagnosis not present

## 2024-02-04 DIAGNOSIS — S3993XA Unspecified injury of pelvis, initial encounter: Secondary | ICD-10-CM | POA: Diagnosis not present

## 2024-02-04 DIAGNOSIS — S22078A Other fracture of T9-T10 vertebra, initial encounter for closed fracture: Secondary | ICD-10-CM | POA: Diagnosis not present

## 2024-02-04 DIAGNOSIS — I444 Left anterior fascicular block: Secondary | ICD-10-CM | POA: Diagnosis not present

## 2024-02-04 DIAGNOSIS — Z79899 Other long term (current) drug therapy: Secondary | ICD-10-CM | POA: Diagnosis not present

## 2024-02-04 DIAGNOSIS — Z7982 Long term (current) use of aspirin: Secondary | ICD-10-CM | POA: Diagnosis not present

## 2024-02-04 DIAGNOSIS — T1490XA Injury, unspecified, initial encounter: Secondary | ICD-10-CM | POA: Diagnosis not present

## 2024-02-04 DIAGNOSIS — S2243XA Multiple fractures of ribs, bilateral, initial encounter for closed fracture: Secondary | ICD-10-CM | POA: Diagnosis not present

## 2024-02-04 DIAGNOSIS — E785 Hyperlipidemia, unspecified: Secondary | ICD-10-CM | POA: Diagnosis not present

## 2024-02-04 DIAGNOSIS — R0789 Other chest pain: Secondary | ICD-10-CM | POA: Diagnosis not present

## 2024-02-04 DIAGNOSIS — M546 Pain in thoracic spine: Secondary | ICD-10-CM | POA: Diagnosis not present

## 2024-02-04 DIAGNOSIS — S270XXA Traumatic pneumothorax, initial encounter: Secondary | ICD-10-CM | POA: Diagnosis not present

## 2024-02-04 DIAGNOSIS — J939 Pneumothorax, unspecified: Secondary | ICD-10-CM | POA: Diagnosis not present

## 2024-02-04 DIAGNOSIS — S22010A Wedge compression fracture of first thoracic vertebra, initial encounter for closed fracture: Secondary | ICD-10-CM | POA: Diagnosis not present

## 2024-02-04 DIAGNOSIS — F32A Depression, unspecified: Secondary | ICD-10-CM | POA: Diagnosis not present

## 2024-02-04 DIAGNOSIS — Z885 Allergy status to narcotic agent status: Secondary | ICD-10-CM | POA: Diagnosis not present

## 2024-02-04 DIAGNOSIS — S2242XA Multiple fractures of ribs, left side, initial encounter for closed fracture: Secondary | ICD-10-CM | POA: Diagnosis not present

## 2024-02-04 DIAGNOSIS — Z90710 Acquired absence of both cervix and uterus: Secondary | ICD-10-CM | POA: Diagnosis not present

## 2024-02-04 DIAGNOSIS — I10 Essential (primary) hypertension: Secondary | ICD-10-CM | POA: Diagnosis not present

## 2024-02-04 DIAGNOSIS — S199XXA Unspecified injury of neck, initial encounter: Secondary | ICD-10-CM | POA: Diagnosis not present

## 2024-02-04 DIAGNOSIS — Z1152 Encounter for screening for COVID-19: Secondary | ICD-10-CM | POA: Diagnosis not present

## 2024-02-04 DIAGNOSIS — M545 Low back pain, unspecified: Secondary | ICD-10-CM | POA: Diagnosis not present

## 2024-02-04 DIAGNOSIS — G8929 Other chronic pain: Secondary | ICD-10-CM | POA: Diagnosis not present

## 2024-02-04 DIAGNOSIS — E7800 Pure hypercholesterolemia, unspecified: Secondary | ICD-10-CM | POA: Diagnosis not present

## 2024-02-05 DIAGNOSIS — S22010A Wedge compression fracture of first thoracic vertebra, initial encounter for closed fracture: Secondary | ICD-10-CM | POA: Diagnosis not present

## 2024-02-06 DIAGNOSIS — S2243XA Multiple fractures of ribs, bilateral, initial encounter for closed fracture: Secondary | ICD-10-CM | POA: Diagnosis not present

## 2024-02-07 DIAGNOSIS — S2243XA Multiple fractures of ribs, bilateral, initial encounter for closed fracture: Secondary | ICD-10-CM | POA: Diagnosis not present

## 2024-02-12 DIAGNOSIS — J438 Other emphysema: Secondary | ICD-10-CM | POA: Diagnosis not present

## 2024-02-12 DIAGNOSIS — S2243XA Multiple fractures of ribs, bilateral, initial encounter for closed fracture: Secondary | ICD-10-CM | POA: Diagnosis not present

## 2024-02-12 DIAGNOSIS — E782 Mixed hyperlipidemia: Secondary | ICD-10-CM | POA: Diagnosis not present

## 2024-02-12 DIAGNOSIS — I10 Essential (primary) hypertension: Secondary | ICD-10-CM | POA: Diagnosis not present

## 2024-02-20 DIAGNOSIS — S2241XD Multiple fractures of ribs, right side, subsequent encounter for fracture with routine healing: Secondary | ICD-10-CM | POA: Diagnosis not present

## 2024-02-20 DIAGNOSIS — S2242XD Multiple fractures of ribs, left side, subsequent encounter for fracture with routine healing: Secondary | ICD-10-CM | POA: Diagnosis not present

## 2024-02-20 DIAGNOSIS — W19XXXD Unspecified fall, subsequent encounter: Secondary | ICD-10-CM | POA: Diagnosis not present

## 2024-02-21 DIAGNOSIS — J942 Hemothorax: Secondary | ICD-10-CM | POA: Diagnosis not present

## 2024-02-21 DIAGNOSIS — S2243XD Multiple fractures of ribs, bilateral, subsequent encounter for fracture with routine healing: Secondary | ICD-10-CM | POA: Diagnosis not present

## 2024-02-21 DIAGNOSIS — J438 Other emphysema: Secondary | ICD-10-CM | POA: Diagnosis not present

## 2024-02-21 DIAGNOSIS — S27321D Contusion of lung, unilateral, subsequent encounter: Secondary | ICD-10-CM | POA: Diagnosis not present

## 2024-02-21 DIAGNOSIS — M79641 Pain in right hand: Secondary | ICD-10-CM | POA: Diagnosis not present

## 2024-02-21 DIAGNOSIS — Z6828 Body mass index (BMI) 28.0-28.9, adult: Secondary | ICD-10-CM | POA: Diagnosis not present

## 2024-02-28 DIAGNOSIS — R300 Dysuria: Secondary | ICD-10-CM | POA: Diagnosis not present

## 2024-02-28 DIAGNOSIS — Z6827 Body mass index (BMI) 27.0-27.9, adult: Secondary | ICD-10-CM | POA: Diagnosis not present

## 2024-03-09 DIAGNOSIS — J942 Hemothorax: Secondary | ICD-10-CM | POA: Diagnosis not present

## 2024-03-09 DIAGNOSIS — J438 Other emphysema: Secondary | ICD-10-CM | POA: Diagnosis not present

## 2024-03-09 DIAGNOSIS — S27321D Contusion of lung, unilateral, subsequent encounter: Secondary | ICD-10-CM | POA: Diagnosis not present

## 2024-03-09 DIAGNOSIS — S2243XD Multiple fractures of ribs, bilateral, subsequent encounter for fracture with routine healing: Secondary | ICD-10-CM | POA: Diagnosis not present

## 2024-03-15 DIAGNOSIS — G4700 Insomnia, unspecified: Secondary | ICD-10-CM | POA: Diagnosis not present

## 2024-03-15 DIAGNOSIS — M79601 Pain in right arm: Secondary | ICD-10-CM | POA: Diagnosis not present

## 2024-03-15 DIAGNOSIS — S2242XD Multiple fractures of ribs, left side, subsequent encounter for fracture with routine healing: Secondary | ICD-10-CM | POA: Diagnosis not present

## 2024-03-15 DIAGNOSIS — Z1321 Encounter for screening for nutritional disorder: Secondary | ICD-10-CM | POA: Diagnosis not present

## 2024-03-15 DIAGNOSIS — Z1329 Encounter for screening for other suspected endocrine disorder: Secondary | ICD-10-CM | POA: Diagnosis not present

## 2024-03-15 DIAGNOSIS — Z6827 Body mass index (BMI) 27.0-27.9, adult: Secondary | ICD-10-CM | POA: Diagnosis not present

## 2024-03-15 DIAGNOSIS — R5383 Other fatigue: Secondary | ICD-10-CM | POA: Diagnosis not present

## 2024-04-10 DIAGNOSIS — Z6827 Body mass index (BMI) 27.0-27.9, adult: Secondary | ICD-10-CM | POA: Diagnosis not present

## 2024-04-10 DIAGNOSIS — L700 Acne vulgaris: Secondary | ICD-10-CM | POA: Diagnosis not present

## 2024-04-24 DIAGNOSIS — H40013 Open angle with borderline findings, low risk, bilateral: Secondary | ICD-10-CM | POA: Diagnosis not present

## 2024-04-24 DIAGNOSIS — H35363 Drusen (degenerative) of macula, bilateral: Secondary | ICD-10-CM | POA: Diagnosis not present

## 2024-04-24 DIAGNOSIS — Z961 Presence of intraocular lens: Secondary | ICD-10-CM | POA: Diagnosis not present

## 2024-04-24 DIAGNOSIS — H3412 Central retinal artery occlusion, left eye: Secondary | ICD-10-CM | POA: Diagnosis not present

## 2024-08-18 DIAGNOSIS — J209 Acute bronchitis, unspecified: Secondary | ICD-10-CM | POA: Diagnosis not present

## 2024-08-18 DIAGNOSIS — Z6827 Body mass index (BMI) 27.0-27.9, adult: Secondary | ICD-10-CM | POA: Diagnosis not present

## 2024-09-04 DIAGNOSIS — I10 Essential (primary) hypertension: Secondary | ICD-10-CM | POA: Diagnosis not present

## 2024-09-04 DIAGNOSIS — R7301 Impaired fasting glucose: Secondary | ICD-10-CM | POA: Diagnosis not present

## 2024-09-04 DIAGNOSIS — Z13220 Encounter for screening for lipoid disorders: Secondary | ICD-10-CM | POA: Diagnosis not present

## 2024-09-04 DIAGNOSIS — R5383 Other fatigue: Secondary | ICD-10-CM | POA: Diagnosis not present

## 2024-09-04 DIAGNOSIS — R739 Hyperglycemia, unspecified: Secondary | ICD-10-CM | POA: Diagnosis not present

## 2024-09-11 DIAGNOSIS — E785 Hyperlipidemia, unspecified: Secondary | ICD-10-CM | POA: Diagnosis not present

## 2024-09-11 DIAGNOSIS — Z23 Encounter for immunization: Secondary | ICD-10-CM | POA: Diagnosis not present

## 2024-09-11 DIAGNOSIS — M545 Low back pain, unspecified: Secondary | ICD-10-CM | POA: Diagnosis not present

## 2024-09-11 DIAGNOSIS — Z6828 Body mass index (BMI) 28.0-28.9, adult: Secondary | ICD-10-CM | POA: Diagnosis not present

## 2024-09-11 DIAGNOSIS — F17210 Nicotine dependence, cigarettes, uncomplicated: Secondary | ICD-10-CM | POA: Diagnosis not present

## 2024-09-11 DIAGNOSIS — R7303 Prediabetes: Secondary | ICD-10-CM | POA: Diagnosis not present

## 2024-09-11 DIAGNOSIS — E781 Pure hyperglyceridemia: Secondary | ICD-10-CM | POA: Diagnosis not present

## 2024-09-11 DIAGNOSIS — I10 Essential (primary) hypertension: Secondary | ICD-10-CM | POA: Diagnosis not present

## 2024-10-13 DIAGNOSIS — I10 Essential (primary) hypertension: Secondary | ICD-10-CM | POA: Diagnosis not present

## 2024-10-13 DIAGNOSIS — F17210 Nicotine dependence, cigarettes, uncomplicated: Secondary | ICD-10-CM | POA: Diagnosis not present

## 2024-10-13 DIAGNOSIS — Z6828 Body mass index (BMI) 28.0-28.9, adult: Secondary | ICD-10-CM | POA: Diagnosis not present

## 2024-10-13 DIAGNOSIS — R011 Cardiac murmur, unspecified: Secondary | ICD-10-CM | POA: Diagnosis not present

## 2024-12-23 ENCOUNTER — Ambulatory Visit: Admitting: Cardiology

## 2025-02-05 ENCOUNTER — Ambulatory Visit: Admitting: Cardiology
